# Patient Record
Sex: Female | Born: 1980 | Race: Black or African American | Hispanic: No | Marital: Single | Smoking: Former smoker
Health system: Southern US, Community
[De-identification: ages and names within clinical notes are randomized; demographics above are authoritative.]

## PROBLEM LIST (undated history)

## (undated) DIAGNOSIS — E119 Type 2 diabetes mellitus without complications: Secondary | ICD-10-CM

## (undated) DIAGNOSIS — Z9889 Other specified postprocedural states: Secondary | ICD-10-CM

## (undated) DIAGNOSIS — E669 Obesity, unspecified: Secondary | ICD-10-CM

## (undated) DIAGNOSIS — F319 Bipolar disorder, unspecified: Secondary | ICD-10-CM

## (undated) DIAGNOSIS — F209 Schizophrenia, unspecified: Secondary | ICD-10-CM

## (undated) DIAGNOSIS — R6250 Unspecified lack of expected normal physiological development in childhood: Secondary | ICD-10-CM

## (undated) DIAGNOSIS — Z8659 Personal history of other mental and behavioral disorders: Secondary | ICD-10-CM

## (undated) DIAGNOSIS — F79 Unspecified intellectual disabilities: Secondary | ICD-10-CM

## (undated) HISTORY — DX: Obesity, unspecified: E66.9

## (undated) HISTORY — DX: Other specified postprocedural states: Z98.890

## (undated) HISTORY — DX: Unspecified lack of expected normal physiological development in childhood: R62.50

## (undated) HISTORY — DX: Type 2 diabetes mellitus without complications: E11.9

## (undated) HISTORY — DX: Personal history of other mental and behavioral disorders: Z86.59

## (undated) HISTORY — PX: TONSILLECTOMY: SUR1361

---

## 2000-08-06 ENCOUNTER — Emergency Department (HOSPITAL_COMMUNITY): Admission: EM | Admit: 2000-08-06 | Discharge: 2000-08-07 | Payer: Self-pay | Admitting: Emergency Medicine

## 2000-10-17 ENCOUNTER — Inpatient Hospital Stay (HOSPITAL_COMMUNITY): Admission: AD | Admit: 2000-10-17 | Discharge: 2000-10-17 | Payer: Self-pay | Admitting: Obstetrics

## 2002-10-07 ENCOUNTER — Encounter: Payer: Self-pay | Admitting: Emergency Medicine

## 2002-10-07 ENCOUNTER — Emergency Department (HOSPITAL_COMMUNITY): Admission: EM | Admit: 2002-10-07 | Discharge: 2002-10-07 | Payer: Self-pay | Admitting: Emergency Medicine

## 2004-08-06 ENCOUNTER — Emergency Department (HOSPITAL_COMMUNITY): Admission: EM | Admit: 2004-08-06 | Discharge: 2004-08-06 | Payer: Self-pay | Admitting: Emergency Medicine

## 2004-08-17 ENCOUNTER — Ambulatory Visit: Payer: Self-pay | Admitting: Family Medicine

## 2004-08-18 ENCOUNTER — Ambulatory Visit: Payer: Self-pay | Admitting: Family Medicine

## 2004-11-08 ENCOUNTER — Ambulatory Visit: Payer: Self-pay | Admitting: Family Medicine

## 2004-12-14 ENCOUNTER — Ambulatory Visit: Payer: Self-pay | Admitting: Family Medicine

## 2004-12-14 ENCOUNTER — Other Ambulatory Visit: Admission: RE | Admit: 2004-12-14 | Discharge: 2004-12-14 | Payer: Self-pay | Admitting: Family Medicine

## 2004-12-20 ENCOUNTER — Ambulatory Visit: Payer: Self-pay | Admitting: Family Medicine

## 2005-01-15 ENCOUNTER — Ambulatory Visit (HOSPITAL_BASED_OUTPATIENT_CLINIC_OR_DEPARTMENT_OTHER): Admission: RE | Admit: 2005-01-15 | Discharge: 2005-01-15 | Payer: Self-pay | Admitting: Family Medicine

## 2005-01-16 DIAGNOSIS — G4733 Obstructive sleep apnea (adult) (pediatric): Secondary | ICD-10-CM | POA: Insufficient documentation

## 2005-01-16 DIAGNOSIS — G4730 Sleep apnea, unspecified: Secondary | ICD-10-CM | POA: Insufficient documentation

## 2005-01-25 ENCOUNTER — Ambulatory Visit: Payer: Self-pay | Admitting: Family Medicine

## 2005-01-29 ENCOUNTER — Ambulatory Visit: Payer: Self-pay | Admitting: Internal Medicine

## 2005-01-29 ENCOUNTER — Emergency Department (HOSPITAL_COMMUNITY): Admission: EM | Admit: 2005-01-29 | Discharge: 2005-01-29 | Payer: Self-pay | Admitting: Emergency Medicine

## 2005-01-30 ENCOUNTER — Ambulatory Visit: Payer: Self-pay | Admitting: Family Medicine

## 2005-04-07 ENCOUNTER — Ambulatory Visit: Payer: Self-pay | Admitting: Family Medicine

## 2005-04-18 ENCOUNTER — Ambulatory Visit: Payer: Self-pay | Admitting: Family Medicine

## 2005-04-26 ENCOUNTER — Ambulatory Visit (HOSPITAL_COMMUNITY): Admission: RE | Admit: 2005-04-26 | Discharge: 2005-04-26 | Payer: Self-pay | Admitting: Otolaryngology

## 2005-07-10 ENCOUNTER — Ambulatory Visit: Payer: Self-pay | Admitting: Family Medicine

## 2005-07-20 ENCOUNTER — Encounter: Admission: RE | Admit: 2005-07-20 | Discharge: 2005-09-19 | Payer: Self-pay | Admitting: Family Medicine

## 2005-07-21 ENCOUNTER — Ambulatory Visit: Payer: Self-pay | Admitting: Family Medicine

## 2005-07-24 ENCOUNTER — Ambulatory Visit: Payer: Self-pay | Admitting: Family Medicine

## 2005-08-17 ENCOUNTER — Inpatient Hospital Stay (HOSPITAL_COMMUNITY): Admission: RE | Admit: 2005-08-17 | Discharge: 2005-09-20 | Payer: Self-pay | Admitting: Otolaryngology

## 2005-08-17 ENCOUNTER — Encounter (INDEPENDENT_AMBULATORY_CARE_PROVIDER_SITE_OTHER): Payer: Self-pay | Admitting: *Deleted

## 2005-08-17 ENCOUNTER — Ambulatory Visit: Payer: Self-pay | Admitting: Critical Care Medicine

## 2005-08-18 ENCOUNTER — Encounter: Payer: Self-pay | Admitting: Cardiology

## 2005-08-18 ENCOUNTER — Ambulatory Visit: Payer: Self-pay | Admitting: Cardiology

## 2005-09-19 ENCOUNTER — Ambulatory Visit: Payer: Self-pay | Admitting: Physical Medicine & Rehabilitation

## 2005-09-20 ENCOUNTER — Inpatient Hospital Stay (HOSPITAL_COMMUNITY)
Admission: RE | Admit: 2005-09-20 | Discharge: 2005-09-26 | Payer: Self-pay | Admitting: Physical Medicine & Rehabilitation

## 2005-09-20 DIAGNOSIS — Z9089 Acquired absence of other organs: Secondary | ICD-10-CM | POA: Insufficient documentation

## 2005-09-29 ENCOUNTER — Ambulatory Visit: Payer: Self-pay | Admitting: Family Medicine

## 2005-09-29 DIAGNOSIS — D509 Iron deficiency anemia, unspecified: Secondary | ICD-10-CM | POA: Insufficient documentation

## 2005-09-30 ENCOUNTER — Inpatient Hospital Stay (HOSPITAL_COMMUNITY): Admission: EM | Admit: 2005-09-30 | Discharge: 2005-10-06 | Payer: Self-pay | Admitting: Emergency Medicine

## 2005-10-02 DIAGNOSIS — N390 Urinary tract infection, site not specified: Secondary | ICD-10-CM | POA: Insufficient documentation

## 2005-10-03 ENCOUNTER — Encounter: Payer: Self-pay | Admitting: Vascular Surgery

## 2005-10-11 ENCOUNTER — Ambulatory Visit: Payer: Self-pay | Admitting: Family Medicine

## 2005-12-21 ENCOUNTER — Ambulatory Visit: Payer: Self-pay | Admitting: Family Medicine

## 2005-12-29 ENCOUNTER — Ambulatory Visit: Payer: Self-pay | Admitting: Internal Medicine

## 2006-07-30 IMAGING — CR DG CHEST 1V PORT
1 series · 1 of 1 positions shown · non-contrast
Comparison: 08/24/05.

CLINICAL DATA: Tonsillar hypertrophy and ARDS.
 PORTABLE CHEST - 1 VIEW - 08/25/05:

[view not recorded]
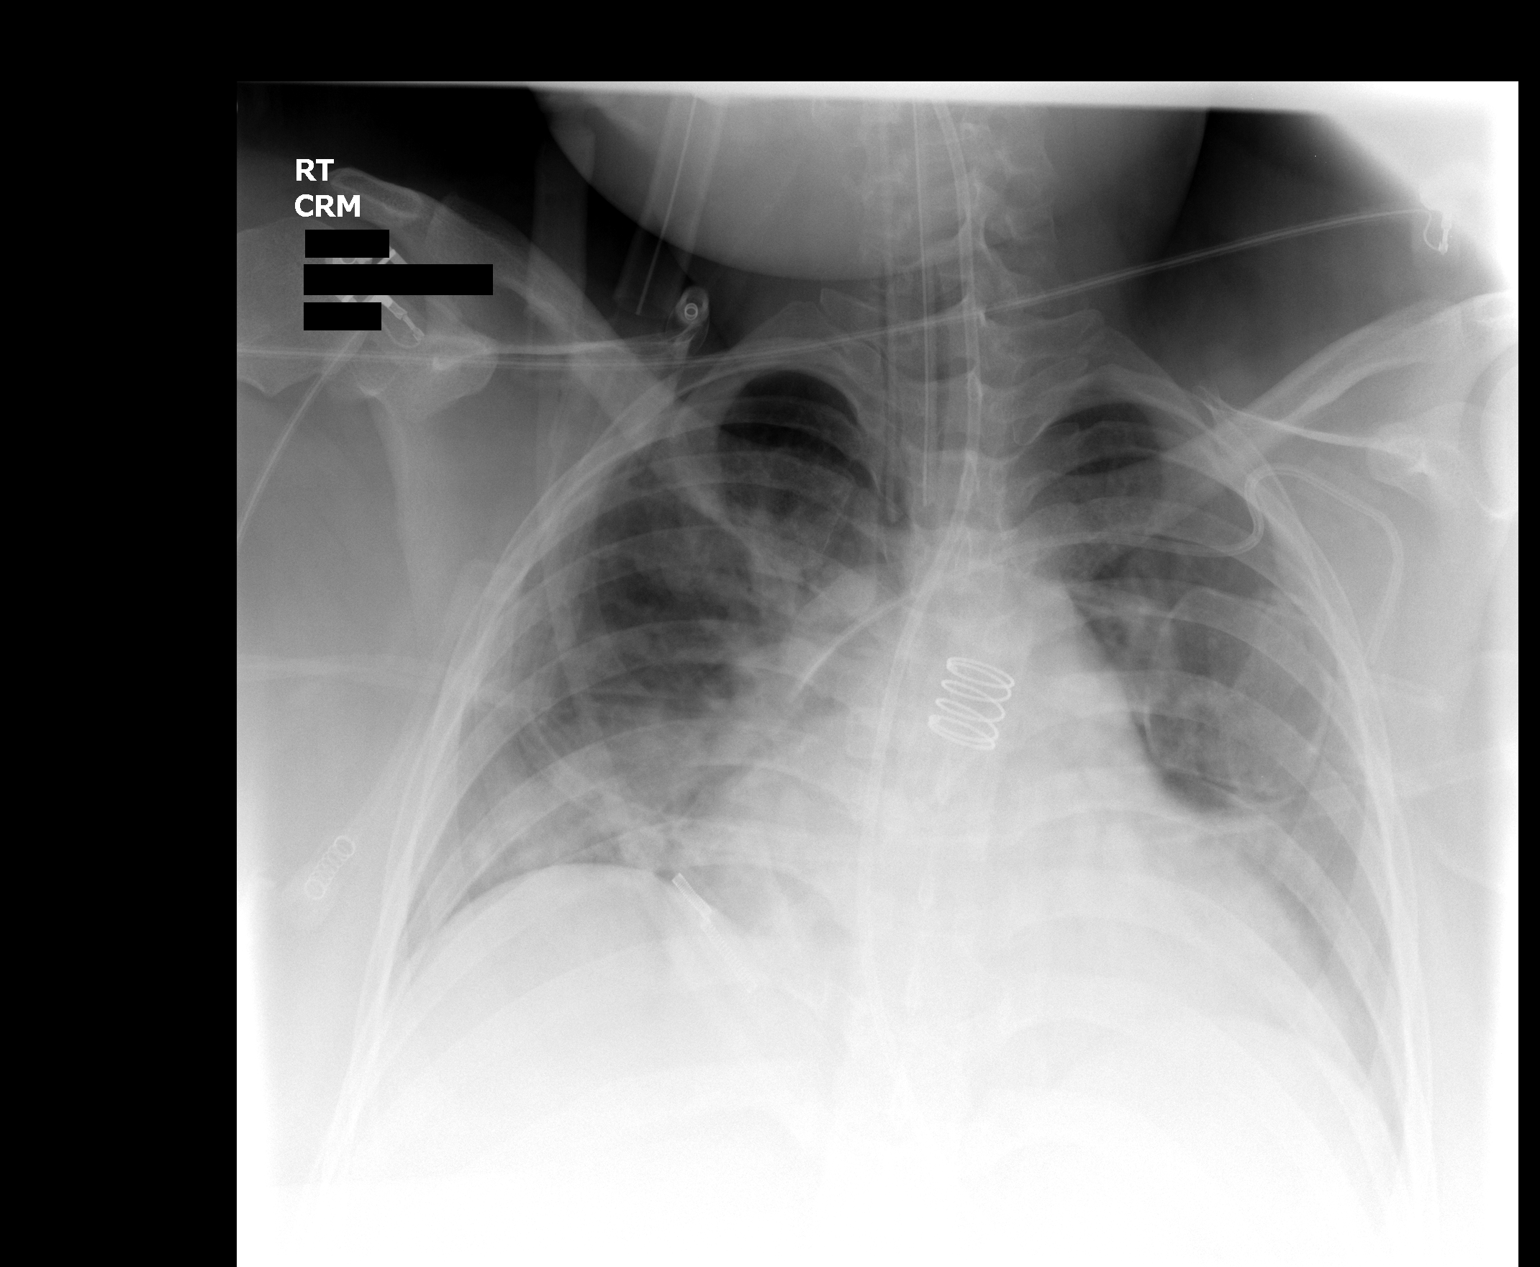

[1 of 1 positions shown; findings below may reference images not displayed]

FINDINGS: AP film at 7077 hours shows stable low lung volumes with no change in cardiomegaly and bilateral air space disease.  The endotracheal tube, left subclavian central line, and feeding catheter remain in place.
IMPRESSION: Stable exam.

## 2006-08-02 IMAGING — CR DG CHEST 1V PORT
1 series · 1 of 1 positions shown · non-contrast
Comparison: 08/27/05.

CLINICAL DATA: 24-year-old female, tonsillar hypertrophy, dependent respiratory failure.  
 PORTABLE CHEST ? 1 VIEW:

[view not recorded]
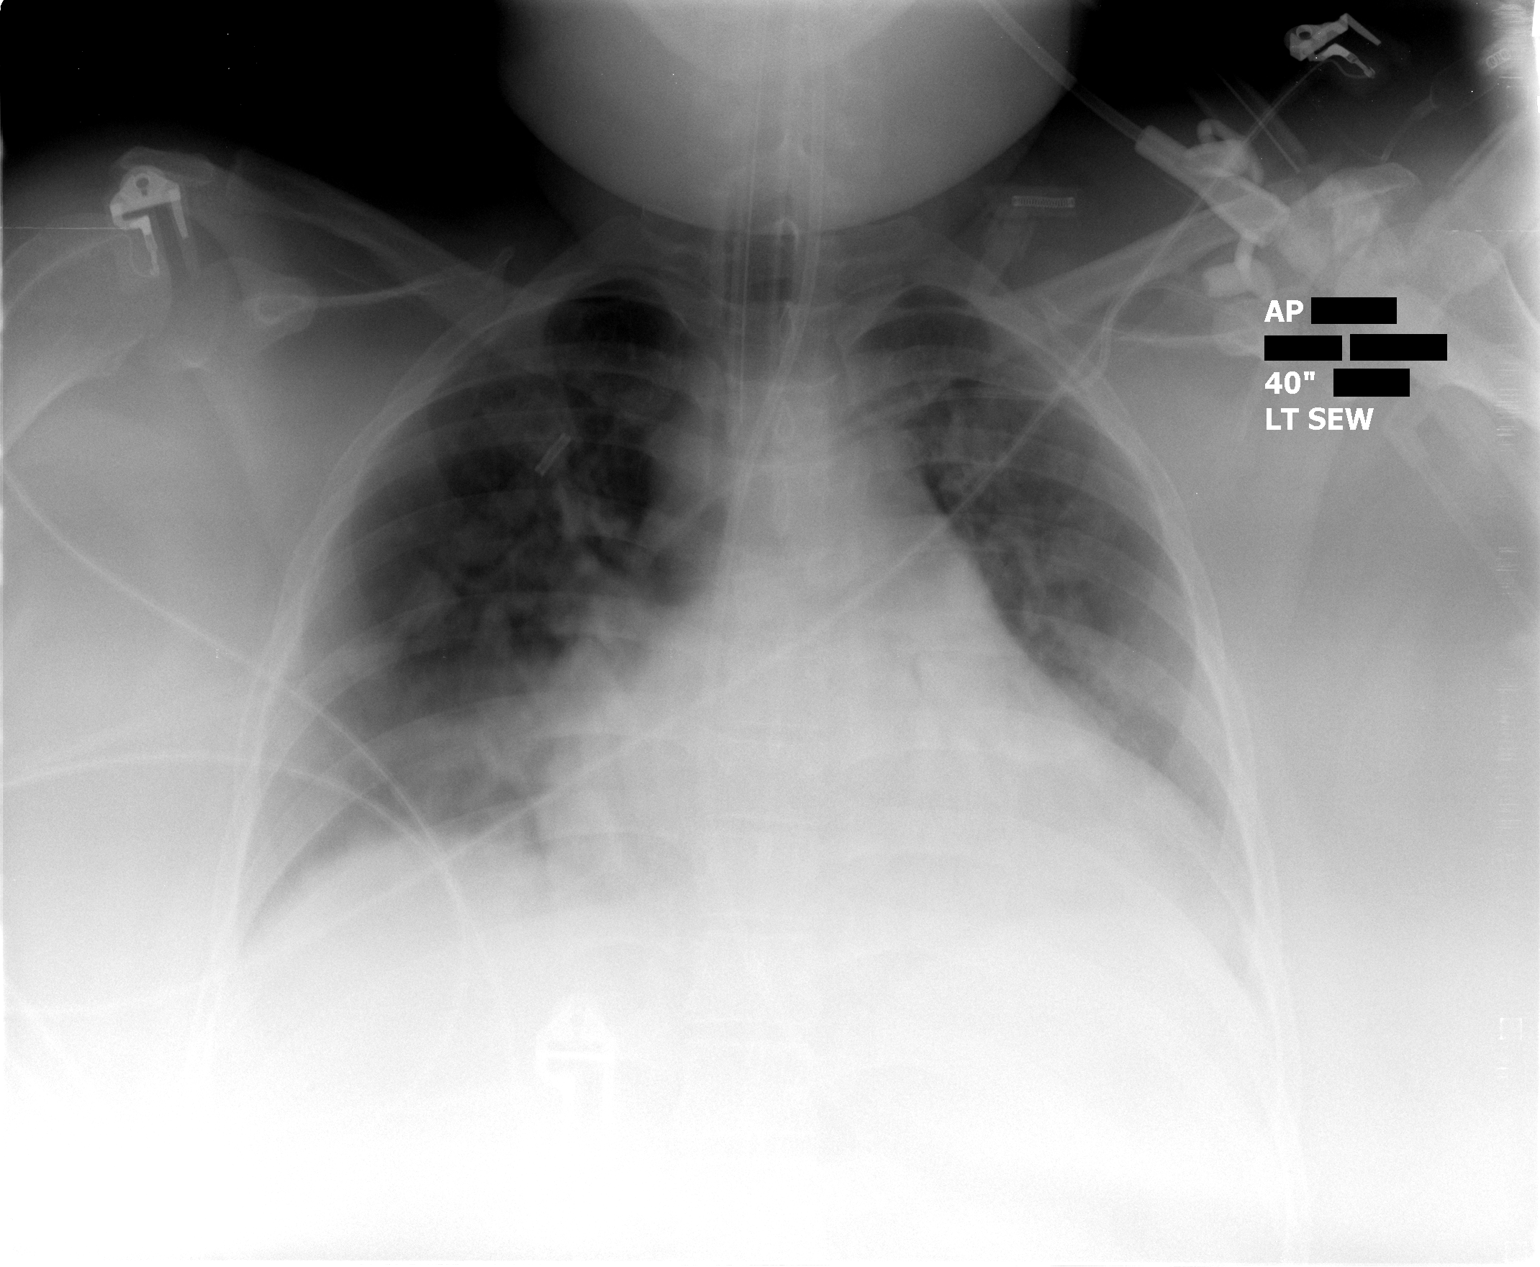

[1 of 1 positions shown; findings below may reference images not displayed]

FINDINGS: Endotracheal tube, left subclavian central line and feeding tube remain.  Exam is limited because of portable technique and patient size.  The heart remains enlarged with stable bilateral airspace disease versus edema.  No significant change in aeration.  Lung volumes remain low.
IMPRESSION: Stable cardiomegaly, low lung volumes and bilateral airspace process.

## 2006-08-04 IMAGING — CR DG ABD PORTABLE 1V
1 series · 1 of 1 positions shown · non-contrast
Comparison: none

CLINICAL DATA: Panda placement.
 PORTABLE ABDOMEN - 1 VIEW:

[view not recorded]
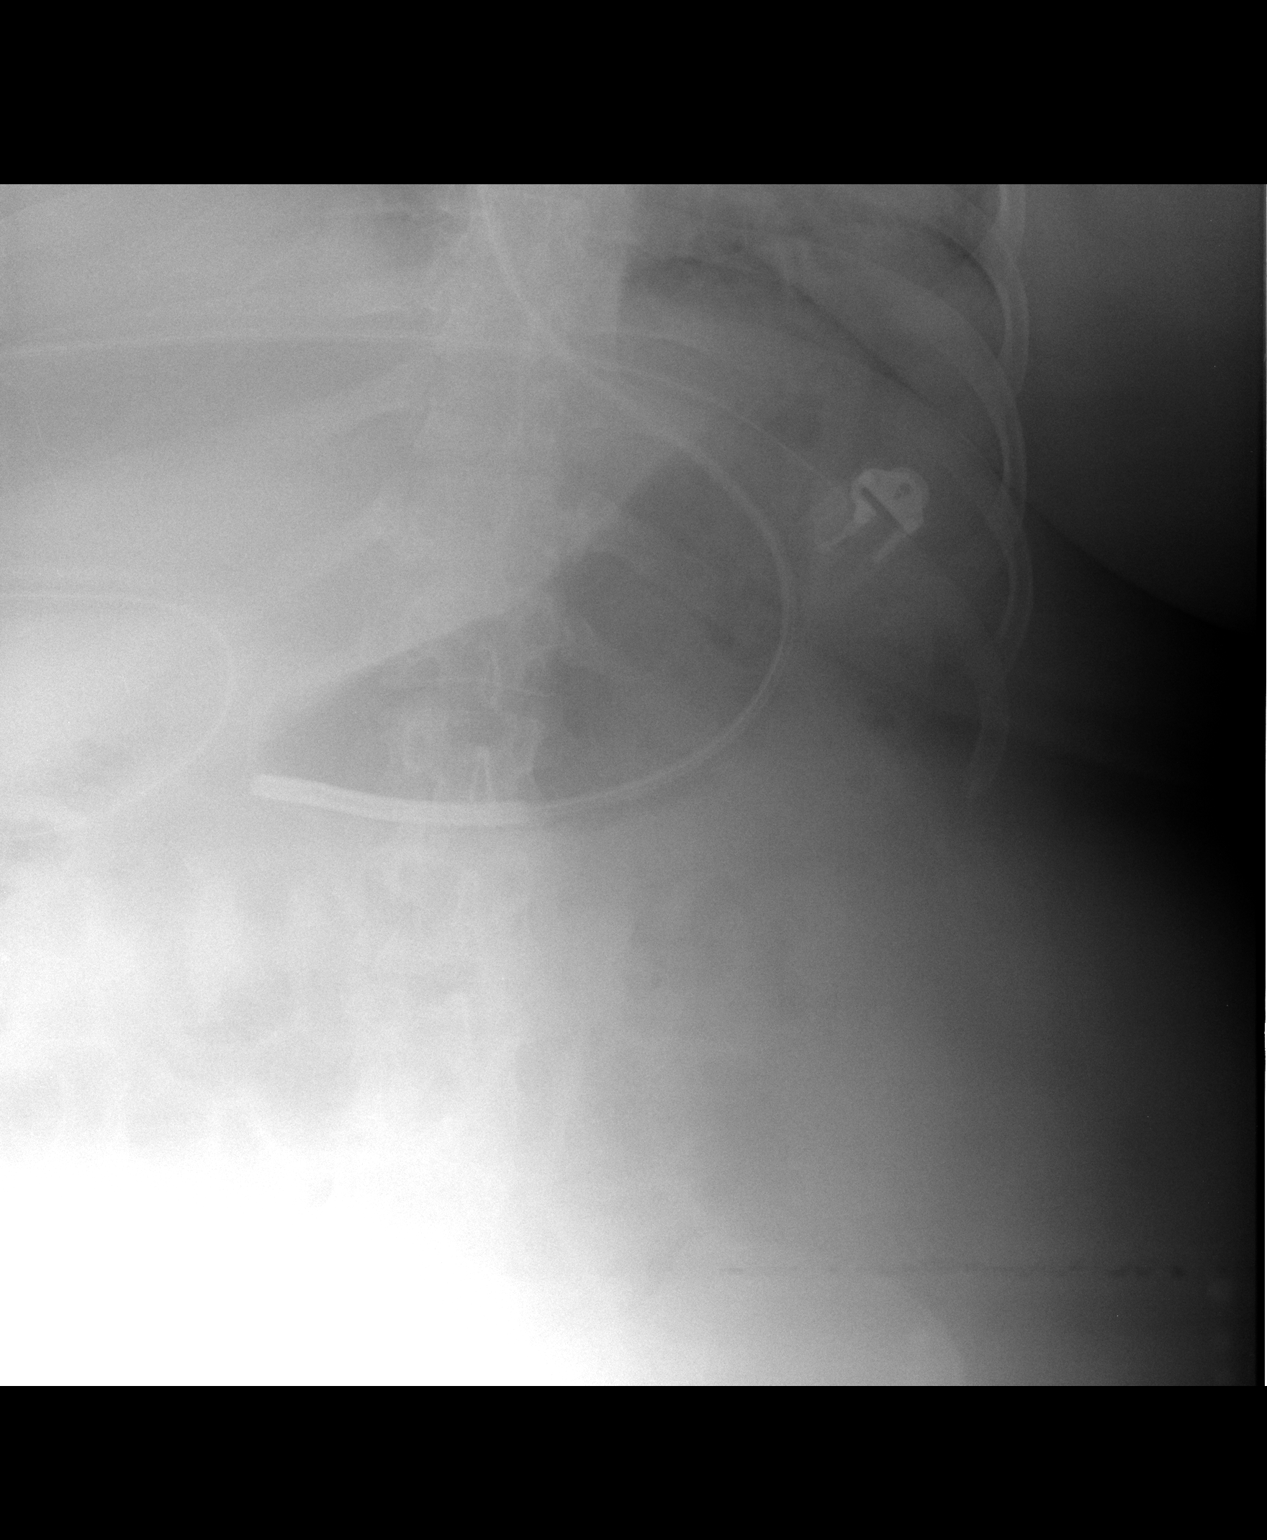

[1 of 1 positions shown; findings below may reference images not displayed]

FINDINGS: Panda tube is in place with the tip in the gastric antrum directed toward the pylorus.
IMPRESSION: As discussed above.

## 2006-08-29 ENCOUNTER — Other Ambulatory Visit: Admission: RE | Admit: 2006-08-29 | Discharge: 2006-08-29 | Payer: Self-pay | Admitting: Family Medicine

## 2006-08-29 ENCOUNTER — Ambulatory Visit: Payer: Self-pay | Admitting: Family Medicine

## 2006-08-29 ENCOUNTER — Encounter (INDEPENDENT_AMBULATORY_CARE_PROVIDER_SITE_OTHER): Payer: Self-pay | Admitting: Family Medicine

## 2006-09-13 ENCOUNTER — Encounter (INDEPENDENT_AMBULATORY_CARE_PROVIDER_SITE_OTHER): Payer: Self-pay | Admitting: Family Medicine

## 2006-09-13 DIAGNOSIS — K219 Gastro-esophageal reflux disease without esophagitis: Secondary | ICD-10-CM | POA: Insufficient documentation

## 2006-09-13 DIAGNOSIS — F3160 Bipolar disorder, current episode mixed, unspecified: Secondary | ICD-10-CM | POA: Insufficient documentation

## 2006-09-13 DIAGNOSIS — L03119 Cellulitis of unspecified part of limb: Secondary | ICD-10-CM

## 2006-09-13 DIAGNOSIS — L02419 Cutaneous abscess of limb, unspecified: Secondary | ICD-10-CM | POA: Insufficient documentation

## 2006-09-13 DIAGNOSIS — F909 Attention-deficit hyperactivity disorder, unspecified type: Secondary | ICD-10-CM | POA: Insufficient documentation

## 2006-09-13 DIAGNOSIS — F71 Moderate intellectual disabilities: Secondary | ICD-10-CM | POA: Insufficient documentation

## 2007-01-22 ENCOUNTER — Emergency Department (HOSPITAL_COMMUNITY): Admission: EM | Admit: 2007-01-22 | Discharge: 2007-01-22 | Payer: Self-pay | Admitting: Emergency Medicine

## 2007-07-24 ENCOUNTER — Telehealth (INDEPENDENT_AMBULATORY_CARE_PROVIDER_SITE_OTHER): Payer: Self-pay | Admitting: *Deleted

## 2007-08-08 ENCOUNTER — Telehealth (INDEPENDENT_AMBULATORY_CARE_PROVIDER_SITE_OTHER): Payer: Self-pay | Admitting: Family Medicine

## 2007-09-10 ENCOUNTER — Telehealth (INDEPENDENT_AMBULATORY_CARE_PROVIDER_SITE_OTHER): Payer: Self-pay | Admitting: Family Medicine

## 2007-09-11 ENCOUNTER — Encounter (INDEPENDENT_AMBULATORY_CARE_PROVIDER_SITE_OTHER): Payer: Self-pay | Admitting: Family Medicine

## 2007-10-15 ENCOUNTER — Encounter (INDEPENDENT_AMBULATORY_CARE_PROVIDER_SITE_OTHER): Payer: Self-pay | Admitting: Family Medicine

## 2007-10-15 ENCOUNTER — Ambulatory Visit: Payer: Self-pay | Admitting: Family Medicine

## 2007-10-15 LAB — CONVERTED CEMR LAB
Nitrite: NEGATIVE
Pap Smear: NORMAL
Specific Gravity, Urine: 1.01
Urobilinogen, UA: 0.2
WBC Urine, dipstick: NEGATIVE

## 2007-10-16 ENCOUNTER — Encounter (INDEPENDENT_AMBULATORY_CARE_PROVIDER_SITE_OTHER): Payer: Self-pay | Admitting: Family Medicine

## 2007-10-18 LAB — CONVERTED CEMR LAB
ALT: 12 units/L (ref 0–35)
AST: 13 units/L (ref 0–37)
Albumin: 4 g/dL (ref 3.5–5.2)
Basophils Absolute: 0 10*3/uL (ref 0.0–0.1)
Basophils Relative: 0 % (ref 0–1)
CO2: 26 meq/L (ref 19–32)
Calcium: 8.9 mg/dL (ref 8.4–10.5)
Cholesterol: 149 mg/dL (ref 0–200)
Eosinophils Absolute: 0.1 10*3/uL (ref 0.0–0.7)
GC Probe Amp, Genital: NEGATIVE
Glucose, Bld: 90 mg/dL (ref 70–99)
Hemoglobin: 12.9 g/dL (ref 12.0–15.0)
Monocytes Relative: 8 % (ref 3–12)
Neutrophils Relative %: 61 % (ref 43–77)
Platelets: 257 10*3/uL (ref 150–400)
RBC: 5.08 M/uL (ref 3.87–5.11)
RDW: 14.8 % (ref 11.5–15.5)
TSH: 3.658 microintl units/mL (ref 0.350–4.50)
Total Bilirubin: 0.6 mg/dL (ref 0.3–1.2)
Total CHOL/HDL Ratio: 3
Triglycerides: 88 mg/dL (ref <150)
WBC: 12.1 10*3/uL — ABNORMAL HIGH (ref 4.0–10.5)

## 2007-10-30 ENCOUNTER — Telehealth (INDEPENDENT_AMBULATORY_CARE_PROVIDER_SITE_OTHER): Payer: Self-pay | Admitting: *Deleted

## 2007-10-30 ENCOUNTER — Encounter (INDEPENDENT_AMBULATORY_CARE_PROVIDER_SITE_OTHER): Payer: Self-pay | Admitting: Family Medicine

## 2007-11-07 ENCOUNTER — Encounter (INDEPENDENT_AMBULATORY_CARE_PROVIDER_SITE_OTHER): Payer: Self-pay | Admitting: Nurse Practitioner

## 2007-11-12 ENCOUNTER — Encounter (INDEPENDENT_AMBULATORY_CARE_PROVIDER_SITE_OTHER): Payer: Self-pay | Admitting: *Deleted

## 2008-01-07 ENCOUNTER — Ambulatory Visit: Payer: Self-pay | Admitting: Family Medicine

## 2008-01-07 DIAGNOSIS — B354 Tinea corporis: Secondary | ICD-10-CM | POA: Insufficient documentation

## 2008-01-07 LAB — CONVERTED CEMR LAB
Bilirubin Urine: NEGATIVE
Blood in Urine, dipstick: NEGATIVE
Glucose, Urine, Semiquant: NEGATIVE
Nitrite: NEGATIVE
Protein, U semiquant: NEGATIVE
Specific Gravity, Urine: 1.01
pH: 6.5

## 2008-01-28 ENCOUNTER — Encounter: Admission: RE | Admit: 2008-01-28 | Discharge: 2008-03-02 | Payer: Self-pay | Admitting: Family Medicine

## 2008-01-28 ENCOUNTER — Encounter (INDEPENDENT_AMBULATORY_CARE_PROVIDER_SITE_OTHER): Payer: Self-pay | Admitting: Family Medicine

## 2008-03-03 ENCOUNTER — Encounter: Admission: RE | Admit: 2008-03-03 | Discharge: 2008-03-03 | Payer: Self-pay | Admitting: Family Medicine

## 2008-03-13 ENCOUNTER — Telehealth (INDEPENDENT_AMBULATORY_CARE_PROVIDER_SITE_OTHER): Payer: Self-pay | Admitting: *Deleted

## 2008-03-24 ENCOUNTER — Ambulatory Visit: Payer: Self-pay | Admitting: Family Medicine

## 2008-03-24 DIAGNOSIS — B353 Tinea pedis: Secondary | ICD-10-CM | POA: Insufficient documentation

## 2008-03-24 DIAGNOSIS — M545 Low back pain: Secondary | ICD-10-CM

## 2008-03-24 LAB — CONVERTED CEMR LAB
LDL Cholesterol: 101 mg/dL — ABNORMAL HIGH (ref 0–99)
VLDL: 21 mg/dL (ref 0–40)

## 2008-04-17 ENCOUNTER — Encounter (INDEPENDENT_AMBULATORY_CARE_PROVIDER_SITE_OTHER): Payer: Self-pay | Admitting: Family Medicine

## 2008-04-17 ENCOUNTER — Ambulatory Visit: Payer: Self-pay | Admitting: Nurse Practitioner

## 2008-04-17 DIAGNOSIS — K5289 Other specified noninfective gastroenteritis and colitis: Secondary | ICD-10-CM

## 2008-05-26 ENCOUNTER — Telehealth (INDEPENDENT_AMBULATORY_CARE_PROVIDER_SITE_OTHER): Payer: Self-pay | Admitting: *Deleted

## 2008-06-02 ENCOUNTER — Ambulatory Visit: Payer: Self-pay | Admitting: Family Medicine

## 2008-06-02 DIAGNOSIS — J069 Acute upper respiratory infection, unspecified: Secondary | ICD-10-CM | POA: Insufficient documentation

## 2008-06-02 DIAGNOSIS — M940 Chondrocostal junction syndrome [Tietze]: Secondary | ICD-10-CM | POA: Insufficient documentation

## 2008-06-10 ENCOUNTER — Telehealth (INDEPENDENT_AMBULATORY_CARE_PROVIDER_SITE_OTHER): Payer: Self-pay | Admitting: Family Medicine

## 2008-08-27 ENCOUNTER — Encounter (INDEPENDENT_AMBULATORY_CARE_PROVIDER_SITE_OTHER): Payer: Self-pay | Admitting: Family Medicine

## 2009-10-16 ENCOUNTER — Emergency Department (HOSPITAL_COMMUNITY): Admission: EM | Admit: 2009-10-16 | Discharge: 2009-10-16 | Payer: Self-pay | Admitting: Emergency Medicine

## 2010-02-20 ENCOUNTER — Emergency Department (HOSPITAL_COMMUNITY): Admission: EM | Admit: 2010-02-20 | Discharge: 2010-02-20 | Payer: Self-pay | Admitting: Emergency Medicine

## 2010-08-16 NOTE — Consult Note (Signed)
NAMECATHALINA, BARCIA NO.:  [id number]   MEDICAL RECORD NO.:  [id number]          PATIENT TYPE:  EMS   LOCATION:  MAJO                         FACILITY:  [facility]   PHYSICIAN:  Kristine Garbe. Ezzard Standing, M.D.DATE OF BIRTH:  [birth year]   DATE OF CONSULTATION:  01/22/2007  DATE OF DISCHARGE:                                 CONSULTATION   REASON FOR EMERGENCY ROOM VISIT:  Evaluate patient with tracheotomy.   HISTORY:  [name] is a 30 year old obese female with severe  obstructive sleep apnea.  She had a trache placed about a year ago.  She  wears a #6 Shiley cuffless trache.  This was removed last night by  herself.  She did not inform any of her caretakers, and presents to the  emergency room with a trache that cannot be replaced by her caretakers.  Respiratory therapy attempted to replace the #6 and also tried a #4  Shiley, but could not place in the tracheotomy site.  I was consulted to  replace the trache, evaluate the patient.  [name] has no respiratory  difficulty and uses the trache primarily at night when she sleeps.  A #4  Shiley trache tube was placed without any difficulty.  There is some  granulation tissue and a little bit of swelling around the tracheotomy  site.  She was able to tolerate the #4 Shiley trache cork well with no  respiratory difficulty.  She previously used a Network engineer and used [employer] valve, mostly during the day and uncorked through the night.  With  the #4 Shiley, she is able tolerate this cork and breathing well and can  probably tolerate this without using a Passy-Muir valve and using the  trache at night uncorked.  Review this with her caretakers.  I will have  her follow up in my office in 2 weeks for recheck.           ______________________________  Kristine Garbe. Ezzard Standing, M.D.     CEN/MEDQ  D:  01/22/2007  T:  01/22/2007  Job:  [id number]

## 2010-08-19 NOTE — Discharge Summary (Signed)
NAMEHAILEY, [name]               ACCOUNT NO.:  [id number]   MEDICAL RECORD NO.:  [id number]          PATIENT TYPE:  INP   LOCATION:  3101                         FACILITY:  [facility]   PHYSICIAN:  Leslye Peer, M.D.  DATE OF BIRTH:  [birth year]   DATE OF ADMISSION:  08/17/2005  DATE OF DISCHARGE:  09/20/2005                                 DISCHARGE SUMMARY   DISCHARGE DIAGNOSES:  1.  Status post acute on chronic respiratory failure/ventilator dependent      respiratory failure secondary to acute upper airway obstruction and      negative pressure fractured capillary pulmonary edema/acute lung injury.  2.  Tracheostomy dependent secondary to morbid obesity, obstructive sleep      apnea, and obesity hypoventilation syndrome.  3.  Cor pulmonale.  4.  Agitated delirium superimposed on underlying bipolar disease and      schitzoeffective disorder as well as mental retardation.  5.  Serratia ventilator associated pneumonia with associated bacteremia      treated May 25 to September 08, 2005 with IV Maxipime.  6.  Coag negative staph x1 blood culture treated with IV Maxipime May 25 to      June 8.  7.  E. coli urinary tract infection treated with IV Maxipime May 25 to September 08, 2005.   PROCEDURE:  1.  Tonsillectomy and adenoidectomy, Aug 17, 2005, Dr. Narda Bonds.  2.  Tracheostomy with #6 Shiley, Aug 29, 2005, Dr. Narda Bonds.  3.  #8 oral endotracheal tube,  May 17 to May 29.  4.  Left subclavian triple lumen catheter, May 17 to May 30.  5.  Left brachial A line, Aug 17, 2005 to Aug 29, 2005.  6.  Right brachial PICC line placed Aug 30, 2005 to present.  7.  #6 trache placed Aug 29, 2005 as above.   CONSULTATIONS:  Antonietta Breach, M.D. on September 04, 2005.   LABORATORY DATA:  1.  September 17, 2005, white blood cells 9.3, hemoglobin 10.3, hematocrit 31.3,      platelets 284.  2.  September 17, 2005, sodium 141, potassium 4.1, chloride 105, CO2 29, glucose      88, BUN 6, creatinine 0.6,  calcium 8.8.   MICROBIOLOGY:  1.  September 16, 2005, respiratory culture, results nonpathogenic oropharyngeal      flora.  2.  Aug 25, 2005, coag negative staph from brachial artery A line cath tip.  3.  Aug 25, 2005, coag negative staph from blood culture.  4.  Aug 27, 2005, blood culture positive with Serratia marcescens.  5.  September 15, 2005, urine culture, E. coli.   BRIEF HISTORY:  This is a morbidly obese 30 year old African-American female  whose baseline weight is 358 pounds with known obstructive sleep apnea  (noncompliant with CPAP), mental retardation, lives in a group home.  Pulmonary critical care team was consulted initially following tonsillectomy  whereby the patient developed acute respiratory distress requiring  reintubation per anesthesia in the post anesthesia recovery room. She  developed extensively high plateau pressures on mechanical ventilation in  excess of 40 cmH2O, she continued to desaturate in spite of high levels of  PEEP of 14-16 cmH2O, as well as recruitment maneuvers. She was significantly  hypotensive secondary to shock and air trapping, she required extensive  resuscitation in the recovery room for refractory shock.   HOSPITAL COURSE BY PROBLEM:  #1.  Acute on chronic respiratory  failure/ventilatory dependent respiratory failure secondary to upper airway  obstruction, and negative pressure capillary fracture pulmonary edema  requiring intubation and mechanical ventilation. [name] was admitted  initially to the medical intensive care. Therapy included empiric  antibiotics with Zosyn for questionable aspiration, initially low tidal  volume ventilation with high levels of PEEP, and aggressive bronchodilators.  As her hypotension resolved after returning to the intensive care she was  then able to transition to aggressive diuresis, she also completed a course  of steroids from May 19 to May 25. Her pulmonary status continued to improve  from a mechanical  ventilation standpoint whereby she was able to initiate  weaning shortly after her stay in the intensive care however give her body  habitus, it was felt unsafe to extubate her without securing an airway. She  therefore underwent tracheostomy on Aug 29, 2005 by Dr. Dillard Cannon.  #2[place]el dependent secondary to upper airway obstruction, and remains  trache dependent secondary to obesity hypoventilation syndrome and  obstructive sleep apnea, again her weight is in excess of 350 pounds upon  time of discharge. Her last recorded weight on September 13, 2005 was 145.4 kg.  Following tracheostomy on Aug 29, 2005, [name] was quickly weaned from  mechanical ventilation and liberated from mechanical ventilation shortly  thereafter. She has since then been weaned to room air support, she has been  transitioned to a cuffless #6 Shiley and is currently tolerating Passy-Muir  valve trials as well as oral intake. It is the opinion of Dr. Delton Coombes and the  pulmonary critical care team that [name]'s long-term plan should include  tracheostomy indefinitely, however should she be successful in significant  weight loss this could certainly be reevaluated in months, to years to come.  #3.  Cor pulmonale, probably secondary to chronic hypoxic respiratory  failure/chronic pulmonary artery hypertension. Treated primarily with  diuresis during this hospitalization and will require occasional p.r.n.  Lasix doses in the outpatient setting.  #4.  Shock, transient after surgery most likely secondary to postoperative  SIRS, as well as air stacking and auto PEEP on mechanical ventilation. This  is treated aggressively with volume resuscitation, early goal directed  therapy approach, as well as empiric antibiotics.  #5.  Serratia bacteremia, most certainly contributing to hypotension and  SIRS, as well as low grade sepsis. She completed a course of Maxipime from Aug 25, 2005 to September 08, 2005.  #6.  E. coli  urinary tract infection, Aug 25, 2005 treated empirically with  Maxipime Aug 25, 2005 to September 08, 2005 and resolved, of date all culture data  is negative in blood culture and urine culture both dated September 08, 2005 as  well as respiratory culture dated September 16, 2005. She did complete another  two-day course of empiric Vancomycin and Zosyn for a questionable tracheal  bronchitis and low-grade fever which demonstrated to be normal oropharyngeal  flora.  #7.  Agitated delirium superimposed on underlying bipolar disease,  schitzoeffective disorder and mental retardation. This was a very large  factor in successfully weaning Ms. Dubuque from mechanical ventilation. She  was initially on [facility]  intensive care sedation protocol, with fentanyl  and Versed. This was eventually transitioned under the assistance of Dr.  Antonietta Breach to the discharge regimen to be named in the following  medication instructions.  #8.  Debilitation. Given the patient's morbid obesity, as well as prolonged  hospitalization, she will require physical rehabilitation. She has been  evaluated by the [facility] for  discharge.   ALLERGIES:  No known drug allergies.   DISCHARGE MEDICATIONS:  1.  Lovenox 80 mg subcu q.24 h.  2.  Clonazepam 0.5 mg q.h.s.  3.  Pepcid 20 mg p.o. b.i.d.  4.  Ferrous sulfate 325 mg p.o. b.i.d.  5.  Valproic acid 500 mg p.o. t.i.d.  6.  Lopressor 25 mg p.o. q.12 h.  7.  Potassium chloride 20 mEq p.o. b.i.d.  8.  Seroquel 100 mg p.o. t.i.d.  9.  Cogentin 2 mg IM b.i.d. p.r.n. or 2 mg p.o. b.i.d. p.r.n.  10. Tylenol 650 mg p.o. p.r.n.  11. Sliding scale insulin with the following coverage a.c. and h.s.      coverage, moderate resistant:  CBG of 60-100=0 units, 101-150=2 units,      151-200=3 units, 201-250=5 units, 251-300=7 units, 301-350=9 units,      greater than 350=11 units.   DIET:  Mechanical soft diet, carb modified.   ACTIVITY:  Out of  bed to chair, routine trache care per hospital policy and  procedure as well as routine Foley care, as well as routine PICC care per  policy and procedure [facility].   PHYSICAL EXAMINATION AND DISPOSITION AT DISCHARGE:  Currently vital signs  are stable, room air saturations 100%, I&O negative 100 for last 8 hours,  respirations 29-33 breaths per minute.  ENT:  Trache with only minimal secretions, currently vocalizes well through  Passy-Muir valve.  PULMONARY:  Assessment with scattered rhonchi and decrease in the bases,  chronically elevated respiratory rate in the high 20s to 30s secondary to  abdominal size.  CARDIAC:  Regular rate and rhythm.  EXTREMITIES:  Trace edema, 2+ pulses, brisk cap refill.  ABDOMEN:  Enlarged, nontender, tolerating diet.  GU:  Clear, yellow.  NEUROLOGIC:  Awake, follows commands, no focal deficits.  DISPOSITION:  Currently ready for discharge, has been medically cleared for  inpatient discharge from pulmonary critical care standpoint.      Anders Simmonds, N.P. [facility]    ______________________________  Leslye Peer, M.D.    PB/MEDQ  D:  09/19/2005  T:  09/19/2005  Job:  [id number]   cc:   [facility]   Antonietta Breach, M.D.   Kristine Garbe. Ezzard Standing, M.D.  Fax: [phone]

## 2010-08-19 NOTE — H&P (Signed)
[name], [name] NO.:  [id number]   MEDICAL RECORD NO.:  [id number]          PATIENT TYPE:  INP   LOCATION:  1405                         FACILITY:  [facility]   PHYSICIAN:  Georgann Housekeeper, MD      DATE OF BIRTH:  06-24-1980   DATE OF ADMISSION:  09/29/2005  DATE OF DISCHARGE:                                HISTORY & PHYSICAL   PRIMARY CARE PHYSICIAN:  None.   CHIEF COMPLAINT:  Left edema, sore and weakness.   HISTORY OF PRESENT ILLNESS:  [age]-year-old female with history of  morbid obesity, status post tonsillectomy and adenoidectomy, May or June of  2007, postop complication with ventilator-dependent respiratory failure,  required tracheostomy, a prolonged course, by the Critical Pulmonary  Medicine, had some bacteriemia with Staph UTI and anemia and discharged on  September 19, 2005.  Initially, she was admitted by Pulmonary for some shortness  of breath and deconditioning and was discharged on September 26, 2005 to her  group home.  The patient went to [facility] today complaining of leg  swelling which started by about 2 or 3 days and some discomfort in the legs  and weakness.  The patient has been at a group home and taken care of by the  caretaker.  She came to the emergency room at [facility] from [place].  The patient denies any fevers or chills, no shortness of breath.  She had  noticed her swelling to be worse when she got out of the hospital about 2 or  3 days ago and some discomfort and no fevers.  In the emergency room, she  initially had a fever of 100.  White count was mildly elevated at 12,000.  The caretaker cannot take care of her in her home and needs to be placed for  possible short-term.   ALLERGIES:  No known drug allergies.   CURRENT MEDICATIONS:  1.  Seroquel 100 mg t.i.d.  2.  Iron 325 b.i.d.  3.  Klonopin 0.5 mg nightly.  4.  Depakote 500 mg t.i.d.  5.  Lopressor 25 mg b.i.d.  6.  Nexium 40 mg daily.  7.  Potassium 20 mEq  b.i.d.   MEDICAL PROBLEMS:  1.  History of bipolar disorder with schizoaffective features.  2.  Mental retardation.  3.  Morbid obesity.  4.  Obstructive sleep apnea.  5.  Gastroesophageal reflux disease.  6.  Status post tonsillectomy and adenoidectomy in June of 2007 complicated      by ventilator-dependent respiratory failure, requiring a tracheostomy      and some pulmonary edema and also Staph bacteriemia.  7.  History of anemia.   SOCIAL HISTORY:  The patient lives in a group home.  No tobacco or alcohol.   FAMILY HISTORY:  Noncontributory.   PHYSICAL EXAMINATION:  VITAL SIGNS:  Temperature 98.2; initially when she  came in it was 100.1.  Blood pressure 133/74.  Pulse rate 89.  Respirations  20.  Pulse oximetry 99%.  GENERAL:  Awake and alert.  She had a tracheostomy collar in place.  LUNGS:  Clear.  CARDIAC:  Regular, S1 and S2, without any murmurs.  ABDOMEN:  Obese, soft.  EXTREMITIES:  She had some edema, 1 to 2+, in both legs and with some mild  warmth on the lower legs and redness with considerable mild cellulitis.   LABORATORY DATA:  White count of 12.8, hemoglobin 10.5.  BNP was 40.  LFTs  normal.  Chemistries:  Sodium 146, potassium 4.3, sugar of 95, BUN of 6,  creatinine 0.6.   IMPRESSION:  [age]-year-old female with multiple medical issues with  psychiatric disorder, mental retardation, recent tonsillectomy with  complication of pulmonary failure/respiratory failure.   1.  Leg edema:  BNP is negative.  Most likely dependent edema, venous stasis      and some mild cellulitis and stasis dermatitis.  2.  Deconditioning and failure to thrive.  3.  Obstructive sleep apnea.  4.  Anemia.   PLAN:  Admit for IV antibiotic, leg elevation and Lasix, Social Services  consult, PT and OT consult.  Continue her psychiatric medications.   PAST MEDICAL HISTORY:      Georgann Housekeeper, MD  Electronically Signed     KH/MEDQ  D:  09/30/2005  T:  09/30/2005  Job:   [id number]

## 2010-08-19 NOTE — Op Note (Signed)
[name], [name]               ACCOUNT NO.:  [id number]   MEDICAL RECORD NO.:  [id number]           PATIENT TYPE:   LOCATION:                                 FACILITY:   PHYSICIAN:  Kristine Garbe. Ezzard Standing, M.D. DATE OF BIRTH:   DATE OF PROCEDURE:  08/29/2005  DATE OF DISCHARGE:                                 OPERATIVE REPORT   PREOPERATIVE DIAGNOSIS:  Obstructive sleep apnea.   POSTOPERATIVE DIAGNOSIS:  Obstructive sleep apnea.   OPERATION:  Tracheotomy with a #6 Shiley trach.   SURGEON:  Kristine Garbe. Ezzard Standing, M.D.   ANESTHESIA:  General endotracheal.   COMPLICATIONS:  None.   BRIEF CLINICAL NOTE:  [name] is a 30 year old female who is status  post tonsillectomy 12 days ago.  Postoperatively, she developed pulmonary  edema requiring admission on vent to ICU.  She has severe obstructive sleep  apnea.  Pulmonary-wise she has improved, but because of her severe  obstructive sleep apnea, she is taken to the operating room at this time for  tracheotomy for better management.   DESCRIPTION OF PROCEDURE:  The patient was brought to the operating room  from the ICU.  She was positioned.  Neck was prepped with Xylocaine solution  and injected with Xylocaine with epinephrine for hemostasis.  A vertical  incision was made just above the cricoid cartilage in the midline.  Dissection was carried down to the subcutaneous tissue and the strap  muscles.  The trachea was identified.  Thyroid isthmus was divided with the  cautery.  A T shaped tracheotomy was performed between the second and third  tracheal rings.  A #6 cuffed Shiley tube was inserted after removing the  endotracheal tube without any difficulty.  This was secured to the neck with  2-0 silk sutures x4 and Velcro trach holder.  This completed the procedure.  [name] was awakened and transferred back to the intensive care unit  postoperatively.           ______________________________  Kristine Garbe Ezzard Standing,  M.D.     CEN/MEDQ  D:  08/29/2005  T:  08/29/2005  Job:  [id number]

## 2010-08-19 NOTE — Consult Note (Signed)
[name], BOER NO.:  [id number]   MEDICAL RECORD NO.:  [id number]          PATIENT TYPE:  INP   LOCATION:  2104                         FACILITY:  [facility]   PHYSICIAN:  Antonietta Breach, M.D.  DATE OF BIRTH:  [birth year]   DATE OF CONSULTATION:  09/04/2005  DATE OF DISCHARGE:                                   CONSULTATION   CONSULTATION FOLLOW UP:  Ms. Roshini has had her eyes open today and can  focus on the examiner briefly, but otherwise she is not able to follow any  commands.  She continues to be unable to tolerate weaning off the  ventilator.  For agitation, she has required p.r.n.'s of Haldol 5 mg at 1300  as well as Ativan 2 mg twice at 0800 and 1400.   She has no extrapyramidal symptoms or abnormal movement.  It is noted that  her agitation level has been reduced.   Her white blood cell count is 13.2 with a hemoglobin of 10.6, her glucose is  126.   She has a temperature of 100.8 and blood cultures are pending.  Blood  pressure 134/78, pulse 112.   ASSESSMENT:  1.  Delirium, not otherwise specified.  2.  History of schizoaffective disorder.   The patient is tolerating the medication changes.  Recommend to decrease  Klonopin to 2 mg per tube t.i.d. to reduce the cognitive dulling and  potential impaired memory and promotion of delirium that Klonopin can cause,  and increase Seroquel to 50 mg per tube b.i.d. and 75 mg q.h.s. to help  reduce her delirium symptoms as well as agitation.   Would draw a Depakote level to rule out excess Depakote possibly  contributing to sedation but continue 500 mg t.i.d. for now for her primary  mood stabilizer in the context of schizoaffective disorder and to help with  acute agitation.      Antonietta Breach, M.D.  Electronically Signed     JW/MEDQ  D:  09/10/2005  T:  09/11/2005  Job:  [id number]

## 2010-08-19 NOTE — Discharge Summary (Signed)
[name], [name]               ACCOUNT NO.:  [id number]   MEDICAL RECORD NO.:  [id number]          PATIENT TYPE:  INP   LOCATION:  1405                         FACILITY:  [facility]   PHYSICIAN:  Melissa L. Ladona Ridgel, MD  DATE OF BIRTH:  [birth year]   DATE OF ADMISSION:  09/29/2005  DATE OF DISCHARGE:  10/06/2005                                 DISCHARGE SUMMARY   CHIEF COMPLAINT ON ADMISSION:  Lower extremity pain and edema.   DISCHARGE DIAGNOSES:  1.  Left lower extremity cellulitis, resolved - status post treatment with      intravenous Ancef, will complete a full course of Keflex.  2.  Urinary tract infection with an Escherichia coli sensitive to      cephalosporins.  3.  Bipolar disease.  The patient will continue with her home medications      and follow up as an outpatient with her psychiatrist.  Her Seroquel has      been increased at bedtime to 200 mg once daily.  4.  Obstructive sleep apnea with trach intact.  The patient will continue      with regular trach care as previously prescribed.  5.  Gastroesophageal reflux disease.  The patient will continue with her      Nexium.  6.  Lower extremity edema, chronic.  The patient will be discharged to home      on Lasix with oral potassium.  She will need followup laboratories every      week.  7.  Sinus tachycardia, likely secondary to her underlying pulmonary      condition.  I have up titrated her Lopressor to 37.5 mg twice daily      which she seems to tolerate.  8.  Mild iron-deficiency anemia.  She will continue with her ferrous sulfate      and I would recommend Colace 100 mg twice daily, holding for loose      stools.   DISCHARGE MEDICATIONS:  1.  Ferrous sulfate 325 mg twice daily.  2.  Colace 100 mg twice daily, hold for loose stools.  3.  Valproic acid 500 mg t.i.d.  4.  Resume Nexium at 40 mg once daily.  5.  Keflex 500 mg four times daily x4 more day.  6.  Lasix 40 mg once daily.  7.  Potassium 40 mEq once  daily.  8.  Lopressor 37.5 mg twice daily.  9.  Seroquel 100 mg at 10 a.m. and 100 mg at 6 p.m.  10. Seroquel 200 mg at bedtime.  11. Klonopin 0.5 mg at bedtime.   The patient will need to have a post hospital visit with her psychiatrist as  well as her primary care physician in the next 1-2 weeks.   HOSPITAL COURSE:  The patient is a 30 year old African American female with  a past medical history for morbid obesity, status post tonsillectomy,  adenoidectomy with complications resulting in vent-dependent respiratory  failure.  The patient subsequently has required tracheostomy.  She suffered  during the course of that hospital stay a staph UTI and anemia, was  discharged on  September 19, 2005.  The patient returned to her group home and  came back to the hospital with lower extremity swelling as well as some  weakness.  She was brought to the emergency room after being seen at  [facility].  She was admitted to the hospital with what appeared to be a  left lower extremity cellulitis and started on IV antibiotic therapy.  The patient was admitted to the telemetry floor, started on IV Ancef, and  continued on her home medications for her bipolar disease.  The patient was  discovered to have a urinary tract infection which was covered with the same  antibiotics used for her lower extremity and therefore this was continued  since she seemed to be making progress on that.  A physical therapy consult  was made and the patient did progress somewhat with PT.  Of note, the  patient had a lower extremity Doppler studies, which showed no obvious  evidence for venous DVT.  During the course of the hospital stay, the  patient's trach was cared for by RT.  She displayed the ability to care to a  degree for her Passy-Muir valve and did not appear to be in distress or have  increased secretions during the course of the stay.  Initially, it was felt  that the patient would not be able to return to her group  home, however, at  this time she appears to be tolerating treatment well and has been accepted  back into their care.  We have up titrated some of her medications to assist  in her schizoaffective disorder.  The patient appears calms and comfortable  today.  I am prepared to return her to her home care setting.   PHYSICAL EXAMINATION:  VITAL SIGNS:  On the day of discharge, the patient's  vital signs have remained stable.  Her blood pressure is 125/67.  Her heart  rate ranges from 108-114, depending on her exertional state.  Her  respiratory rate is 22-24 with a saturation of 100% on room air.  GENERAL:  This is a morbidly obese African American female in no acute  distress.  HEENT:  She is normocephalic atraumatic.  Pupils are equal, round, reactive  to light.  Extraocular muscles appear to be intact.  Her trach is intact,  appears to be clean dry with minimal erythema.  CHEST:  Decreased but clear to auscultation with no rhonchi, rales, or  wheezes.  CARDIOVASCULAR:  Regular rate and rhythm.  With exertion, she does become  tachycardic.  ABDOMEN:  Obese, nontender, nondistended with positive bowel sounds.  EXTREMITIES:  Show 1+ to 2 edema which has improved somewhat with the Lasix  which I will continue.  Power is 5/5.  Gait is intact.   DISCHARGE LABORATORY VALUES:  Reveal the last white count on October 04, 2005,  of 10.9, hemoglobin of 9.6, hematocrit 29.5, platelets of 273.  Her BUN is  6, creatinine is 0.6 with a sodium of 140 and a potassium of 3.7.  I would  recommend rechecking the basal metabolic panel next week.  Please note that  the patient does have greater than 100,000 E. coli which were sensitive to  cefazolin and therefore, she has been continued on Keflex.  She does have a  urine culture which is growing 20,000 enterococcus.  The sensitivities have  not returned on this but this likely is colonization and not symptomatic at this time; I therefore will not change her  therapy.  I  will follow up,  however, to determine whether this is vancomycin resistant or not.   At this time, the patient is deemed stable for discharge back to the group  home setting.  She is to follow up with [facility] this week as well as her  psychiatrist.   Addendum: Please note the patient's urine culture returned with VRE.  A call  was placed to the patient's care giver.  The patient is asymptomatic and  will not require treatment for this colonizing organism.  I have advised the  patient's care giver to allow patient to use a designated bathroom for her  own use and to use gloves when cleaning up after the client.  MLT      Melissa L. Ladona Ridgel, MD  Electronically Signed     MLT/MEDQ  D:  10/06/2005  T:  10/06/2005  Job:  [id number]

## 2010-08-19 NOTE — Consult Note (Signed)
[name], [name] NO.:  [id number]   MEDICAL RECORD NO.:  [id number]          PATIENT TYPE:  INP   LOCATION:  3038                         FACILITY:  [facility]   PHYSICIAN:  Antonietta Breach, M.D.  DATE OF BIRTH:  Nov 05, 1980   DATE OF CONSULTATION:  09/01/2005  DATE OF DISCHARGE:  09/20/2005                                   CONSULTATION   REASON FOR CONSULTATION:  Severe agitation.   HISTORY OF PRESENT ILLNESS:  [name] is a 30 year old female on Aug 17, 2005.  She has had severe, restrictive sleep apnea.  She required a trach on  Aug 29, 2005.  She has not been able to wean off of the ventilator due to  severe agitation which is interfering.  We were consulted to help with this.   The patient has been on multiple medications.  She has been receiving  several p.r.n. doses of Ativan, up to 14 mg so far on September 01, 2005.  She  also has been given 10 mg of Haldol today.  The Haldol has been scheduled  every four hours which has helped some but it has been unable to resolve a  significant amount of this severe agitation breakthrough.   PAST PSYCHIATRIC HISTORY:  There is a reported history of bipolar disorder  in the chart as well as attention deficit hyperactivity disorder.  There is  no definitive psychiatric history available.  The patient is not able to be  a historian.   FAMILY PSYCHIATRIC HISTORY:  None known.   RELIGION:  [employer].   SOCIAL HISTORY:  Marital status:  Single.  The patient resides in a group  home.  There is no known history of alcohol or illegal drug use.   GENERAL MEDICAL PROBLEMS:  Chronic obstructive pulmonary disease,  obstructive sleep apnea, and obesity.   MEDICATIONS:  The MAR is reviewed.  1.  Psychotropics include Abilify 30 mg daily.  2.  Tegretol 200 mg t.i.d.  3.  Klonopin 3 mg q.6h.  4.  Depakote has been added 500 mg t.i.d. on Aug 31, 2005.  5.  Haldol is 5 mg q.2h.  6.  Ativan 1 to 2 mg q.6h.   ALLERGIES:  No  known drug allergies.   LABORATORY DATA:  Hemoglobin 10.4, AST 38, ALT 66.   REVIEW OF SYSTEMS:  Constitutional:  Afebrile.  HEAD:  No trauma.  No known  visual changes.  There have been no new respiratory complications or  infection.  GASTROINTESTINAL:  By record, no history of nausea, vomiting, or  diarrhea.  GENITOURINARY:  No history of recent urinary tract infection.  SKIN:  Unremarkable.  MUSCULOSKELETAL:  No deformities, weakness, or  atrophy.  ENDOCRINE/METABOLIC:  There is severe obesity.  The above review  of systems is limited due to having been done by medical record review only.   EXAMINATION:  VITAL SIGNS:  Temperature 99.5, pulse 132, respiration 20,  blood pressure 110/55.  MENTAL STATUS EXAM:  [name] has her eyes open.  She is not able to  verbally communicate due to her trach status  but her affect is agitated.  She does not appear to be responding to internal stimuli.   ASSESSMENT:   AXIS I:  Delirium, not otherwise specified, 293.00  Mood disorder, not otherwise specified (history of functional bipolar  disorder by record.  She also has some general medical factors), 293.83.   AXIS II:  MR, by history.   AXIS III:  See general medical problems.   AXIS IV:  General medical.   AXIS V:  20.   RECOMMENDATIONS:  1.  With the possibility that Abilify could be contributing to akathisia,      would stop the Abilify and place the patient on standing Seroquel.  2.  Would increase the Depakote to 750 mg t.i.d. for mood stabilization.  3.  Would also try reducing Ativan p.r.n. as it may be contributing to      disinhibition and clouding consciousness.  4.  Would monitor for extra __________ side effects and administer Cogentin      1 to 2 mg b.i.d. if EPS occurs.      Antonietta Breach, M.D.  Electronically Signed     JW/MEDQ  D:  10/15/2005  T:  10/16/2005  Job:  [id number]

## 2010-08-19 NOTE — H&P (Signed)
NAMENOAM, FRANZEN NO.:  [id number]   MEDICAL RECORD NO.:  [id number]          PATIENT TYPE:  IPS   LOCATION:  4144                         FACILITY:  [facility]   PHYSICIAN:  Ranelle Oyster, M.D.DATE OF BIRTH:  Dec 27, 1980   DATE OF ADMISSION:  09/20/2005  DATE OF DISCHARGE:                                HISTORY & PHYSICAL   PRIMARY CARE PHYSICIAN:  Jillyn Hidden A. Rankin, M.D.   SERVICE:  Critical Care Medicine.   CONSULTING PHYSICIAN:  Dr. Ezzard Standing.   CHIEF COMPLAINT:  Deconditioning, shortness of breath and weakness.   HISTORY OF PRESENT ILLNESS:  This is a 30 year old black female with a  history of mental retardation and obstructive sleep apnea using a CPAP  intermittently at home, the patient also suffers with morbid obesity, who  was admitted on May 17 for treatment of adenotonsillar hypertrophy with  obstructive symptoms. The patient underwent T&A and bilateral turbinate  reduction by Dr. Ezzard Standing on May 17. The patient had a postoperative course  complicated by vent dependent respiratory failure and pulmonary edema. The  patient had a trache placed May 29 prior to extubation. The patient also was  noted to have one positive coag negative staph blood culture and an E. coli  UTI which was treated with Maxipime from May 25 to September 08, 2005. The patient  was seen by Dr. Jeanie Sewer for agitation and delirium. She was placed on  Depakote 500 mg t.i.d. as a mood stabilizer as well as Seroquel 100 mg  t.i.d. for any psychosis. The patient continues with trache and is using a  PMV valve for speech. [place]el is recommended long-term until patient looses  significant weight. The patient to have difficulties with mobility and self  care and it was decided to bring her to rehab to further her functional  abilities.   REVIEW OF SYSTEMS:  Positive for insomnia, anxiety, decreased energy, sleep,  reflux. She denies pain. She did have some wheezing and shortness of breath  particularly with exertion.   PAST MEDICAL HISTORY:  Positive for mental retardation, obstructive sleep  apnea, morbid obesity, bipolar disorder with schitzoeffective components.   FAMILY HISTORY:  Positive for HIV and questionable renal disease.   SOCIAL HISTORY:  The patient lives in a group home which was supervision  independent prior to arrival. She had a CAPS worker through [employer].  She does not drink or smoke. She lives in a one-level house with five steps  to enter.   FUNCTIONAL HISTORY:  The patient states that she was independent and could  walk essentially an unlimited distance prior to her hospitalization.   ALLERGIES:  None.   HOME MEDICATIONS:  1.  Abilify 30 mg a day.  2.  Concerta 36 mg 2 tabs a day.  3.  Equetro 200 mg q.a.m. and p.m.  4.  Hydrochlorothiazide 12.5 mg daily.  5.  Depo-Provera q.3 months.   PHYSICAL EXAMINATION:  Blood pressure is 134/82, pulse is 109, respiratory  rate 22, temperature 97.9. The patient is somewhat flat, and in no acute  distress. She remains morbidly obese. She  is wearing her trache. She is able  to speak very clearly through her tache today.  HEENT:  Pupils equal round and reactive to light. Extraocular eye movements  are intact. Ear, nose and throat exam unremarkable except for her trache.  NECK:  Supple without JVD or lymphadenopathy.  CHEST:  Reveals some decreased breath sounds at the bases and some upper  sounds around the throat otherwise clear to auscultation.  HEART:  Regular rate and rhythm without murmurs, rubs or gallops.  EXTREMITIES:  Show no clubbing or cyanosis and trace edema at the ankles and  feet.  ABDOMEN:  Soft, nontender, bowel sounds are positive.  NEUROLOGIC:  The patient has intact cranial nerve exam. Reflexes are 1+.  Sensation is normal. Judgment, orientation and memory were intact for simple  questioning and command following. She has decreased awareness for more  complex thoughts and  situations. Affect was a bit flat but patient did  respond to cueing and did smile appropriately at times. Motor function was  3+ to 4/5, proximal in the upper extremities. She is 4+/5 distally in the  upper extremities. Bilateral lower extremities are 2+ to 3/5 proximally to  4/5 distally.   ASSESSMENT/PLAN:  1.  Functional deficit secondary to deconditioning plus multiple medical      complications. Begin comprehensive inpatient rehab with PT to evaluate      and treat mobility and lower extremity strengthening. OT will assess for      upper extremity use and ADLs. Speech will follow for swallowing and any      cognitive issues. 24-hour rehab nursing will monitor patient closely for      bowel, bladder, skin, medication and oxygenation. Rehab case      manager/social worker will assess for psychosocial needs and      disposition. Estimated length of stay is 3 weeks. Goal is supervision.      Prognosis fair.  2.  Pain management p.r.n. Tylenol.  3.  DVT prophylaxis subcu Lovenox.  4.  Bipolar disorder:  Continue Depakote and Seroquel for now.  5.  Tachycardia:  Lopressor.  6.  Reflux disease:  Nexium.  7.  Obstructive sleep apnea:  Continue humidified air. The patient will have      trache in the for the near future until significant weight loss is      achieved.  8.  Attention/arousal:  Continue to hold Concerta for now due to history of      psychosis.  9.  Acute blood loss anemia:  Iron supplement.  10. Hyperglycemia:  Will monitor her CBGs for now.  11. Bowels:  Monitor bowel schedule with nurses help. Supplement schedule as      needed.      Ranelle Oyster, M.D.  Electronically Signed     ZTS/MEDQ  D:  09/20/2005  T:  09/20/2005  Job:  [id number]   cc:   Jillyn Hidden A. Rankin, M.D.  Fax: [phone]

## 2010-08-19 NOTE — Op Note (Signed)
[name], [name] NO.:  [id number]   MEDICAL RECORD NO.:  [id number]          PATIENT TYPE:  OIB   LOCATION:  2104                         FACILITY:  [facility]   PHYSICIAN:  Kristine Garbe. Ezzard Standing, M.D.DATE OF BIRTH:  Feb 08, 1981   DATE OF PROCEDURE:  08/17/2005  DATE OF DISCHARGE:                                 OPERATIVE REPORT   PREOPERATIVE DIAGNOSES:  1.  Adenotonsillar hypertrophy with obstructive symptoms.  2.  Turbinate hypertrophy with obstructive symptoms.  3.  History of obstructive sleep apnea.   POSTOPERATIVE DIAGNOSES:  1.  Adenotonsillar hypertrophy with obstructive symptoms.  2.  Turbinate hypertrophy with obstructive symptoms.  3.  History of obstructive sleep apnea.   OPERATION PERFORMED:  1.  Tonsillectomy and adenoidectomy.  2.  Bilateral hypertrophied turbinate reduction.   SURGEON:  Kristine Garbe. Ezzard Standing, M.D.   ANESTHESIA:  General endotracheal.   COMPLICATIONS:  None.   BRIEF CLINICAL NOTE:  [name] is a 30 year old female who has  obstructive sleep apnea for which she uses home nasal CPAP.  She has very  loud snoring with severe oxygen desaturation.  She is significantly  overweight with severe obstructive sleep apnea.  On clinical exam she has  very large tonsils.  She really does not tolerate her nasal CPAP well; and  she is brought to the operating room at this time for a tonsillectomy,  adenoidectomy and turbinate reductions  to help to see if this improves her  sleep apnea, and, as well, will allow her to tolerate her nasal CPAP better.   I discussed with [name] as well as with her caregiver that she will still  have sleep apnea and will require treatment with nasal CPAP following  surgery.  I discussed with them also the risks of tonsillectomy concerning  morbidity associated with pain and the risks of postop bleeding.   DESCRIPTION OF THE OPERATION:  After adequate endotracheal anes the nose was  examined first.   The patient had large turbinates bilaterally.  Bipolar  submucosal cauterization of the turbinates was performed and the turbinate  bone was outfractured bilaterally.  Papaverine packs were placed for  hemostasis.  This completed the turbinate reductions.   Following this a mouth gag was used to exposed the oropharynx.  The left and  right tonsils were dissected from the tonsillar fossae using the cautery.  Hemostasis was obtained with the cautery.  The patient had a long uvula and  the lower two-thirds of the uvula was amputated after removing the tonsils.  This completed the tonsillectomy.   The red rubber catheter was then passed through the nose and out the mouth  to retract the soft palate.  The nasopharynx was examined.  [name] had just  a moderate amount of adenoid tissue.  Suction cautery was used to cauterize  the central pad of adenoid tissue that remained.  This completed the  procedure.   The nose and nasopharynx were irrigated with saline.   [name] was awakened from anesthesia and transferred to recovery room postop  doing well.   DISPOSITION:  [name] will be [facility] overnight  in [facility]  and discharged home on amoxicillin suspension 500 mg twice a day for one  week and Tylenol or Lortab elixir 1-1.5 T q.4 hours p.r.n. pain.  She will  follow up in my office in two weeks for recheck.           ______________________________  Kristine Garbe. Ezzard Standing, M.D.     CEN/MEDQ  D:  08/17/2005  T:  08/18/2005  Job:  [id number]   cc:   Fanny Dance. Rankins, M.D.  Fax: [phone]

## 2010-08-19 NOTE — Discharge Summary (Signed)
NAMELOUIZA, MOOR NO.:  [id number]   MEDICAL RECORD NO.:  [id number]          PATIENT TYPE:  IPS   LOCATION:  4144                         FACILITY:  [facility]   PHYSICIAN:  Ellwood Dense, M.D.   DATE OF BIRTH:  1980/10/20   DATE OF ADMISSION:  09/20/2005  DATE OF DISCHARGE:  09/26/2005                                 DISCHARGE SUMMARY   DISCHARGE DIAGNOSES:  1.  Deconditioning secondary to VDRF, past tonsilloadenoidectomy with      bilateral turbinate reduction.  2.  Bipolar disorder with schizoaffective features.  3.  Tachycardia.  4.  Gastroesophageal reflux disease.  5.  Morbid obesity.  6.  Obstructive sleep apnea.   HISTORY OF PRESENT ILLNESS:  Miss Vessel is a 30 year old female with  history of MRSA with CPAP use and morbid obesity admitted Aug 17, 2005 for  treatment of tonsillar hypertrophy with obstructive symptoms.  The patient  underwent tonsillectomy and adenoidectomy with bilateral turbinate reduction  by Dr. Narda Bonds.  Postop was complicated by VDRF, secondary to pulmonary  edema, requiring tracheostomy on Aug 29, 2005 prior to extubation.  The  patient also was noted to have one positive, one growing staph blood culture  and E. Coli UTI, treated with IV Maxipime.  Dr. Jeanie Sewer was consulted.  The patient with agitation and delirium.  He recommended using Depakote for  mood stabilizer and Seroquel for antipsychotic.  Follow up with mental  health service passed discharge.  Critical care medicine recommended trache  indefinitely for obstructive sleep apnea, which is bad.  The patient  currently will tolerate Passy-Muir valve and capping.  Therapy is initiated  and the patient was noted to have weakness of bilateral extremities with  buckling and environmental self care.  Rehab consult for further therapy.   PAST MEDICAL HISTORY:  Significant for MR, obstructive sleep apnea, morbid  obesity, bipolar disorder with schizoaffective  features.   ALLERGIES:  No known drug allergies.   FAMILY HISTORY:  Positive for HIV and questionable renal disease.   SOCIAL HISTORY:  The patient lives in a group home with distant supervision  level.  She lives in a one-level home with 5 steps at entry.  Does not use  any tobacco or alcohol.  The patient was independent prior to admission.   HOSPITAL COURSE:  Miss Brendi Mccarroll was admitted to rehab on September 20, 2005  for inpatient therapy, to consistent of PT/OT.  Last admission, the  patient's blood pressures were monitored on b.i.d. basis.  She has continued  with tachycardia with heart rates from 90s to 110.  Blood pressures were all  from 100 to 130 systolic, at 70 diastolic.  [name] O2 sat have ranged  from 92 to 98% on room air with positive plugging with Passy-Muir valve.   Labs on the past admission, showed the patient's anemia to be stable with  hemoglobin 10.1, hematocrit 30.6, white count 11.5, platelets 245,000.  Electrolytes reveals sodium 135, potassium 3.8, chloride 100, CO2 28, BUN 6,  creatinine 0.7, glucose 82.  LFTs with some elevation with AST at 63,  ALT  90.  Hemoglobin A1c was noted to be elevated at 6.4.  The patient was  maintained on carb-modified diet, which initially ranged from 90s to  occasionally high in the 130s.   The patient has been instructed regarding dietary discretion and also  regarding weight loss.  She has been attending outpatient nutrition center  is to continue with them passed discharge.  [name] mood has been  stable on valproic acid 500 mg every 8 hours, Seroquel 100 mg every 8 hours,  Klonopin 0.5 mg nightly.  The patient continues on this currently.  She is  to follow up with her psychiatrist regarding further adjustments of meds.   The patient has been continent of bowel and bladder.  She has been resistant  to using humidified J-collar at night.  Initially, she was also hesitant,  regarding having her caregiver do  trachea; however, on discharge, the  patient was agreeable to have caregiver drain regarding trache care and  trache management.  [facility] is also set up to include home  health, PT/RN passed discharge.  Of note, the patient did receive a dose of  Depo-Medrol 150 mg IM on September 25, 2005.  She was also noted to have E. coli  UTI and was treated with Keflex for 7 total days of antibiotic therapy.   During her stay in rehab, Miss Schnyder has progressed along well.  She is  currently at supervision level without assistive device for transfers and  ambulating 20 feet x2.  She is at supervision level to navigate two stairs.  Today, supervision is available passed discharge.   DISCHARGE MEDICATIONS:  Keflex 500 mg p.o. q.i.d., Klonopin 0.5 mg nightly,  ferrous sulfate 325 mg b.i.d., Lopressor 50 mg 1/2 p.o. b.i.d., Seroquel 100  mg every 8 hours, valproic acid 500 mg every 8 hours, Nexium 40 mg daily, K-  Dur 20 mEq daily, hydrochlorothiazide 25 mg 1/2 to 1 p.o. p.r.n.   Diet is low fat, weight reduced.  Wound care:  Continue routine trache care  with cleaning of in and out trache cannula daily.  Activities as tolerated  with 24-hour supervision.  No alcohol, no smoking.  The patient instructed  not to use Concerta, ____________.  Followup:  The patient to follow up with  Dr. Ezzard Standing for postop checks.  Follow up with Dr. Beverley Fiedler for  routine check, follow up with Dr. Theophilus Bones in [facility] for further  medications changes, follow up with Dr. Lamar Benes as needed.      Greg Cutter, P.A.    ______________________________  Ellwood Dense, M.D.    PP/MEDQ  D:  09/26/2005  T:  09/26/2005  Job:  [id number]   cc:   Fanny Dance. Rankins, M.D.  Fax: [phone]   Kristine Garbe. Ezzard Standing, M.D.  Fax: [phone]   Lonny Prude  Fax: [phone]   Dr. Theophilus Bones  [phone]

## 2010-08-19 NOTE — Consult Note (Signed)
NAMESHANICE, [name] NO.:  [id number]   MEDICAL RECORD NO.:  [id number]          PATIENT TYPE:  INP   LOCATION:  1405                         FACILITY:  [facility]   PHYSICIAN:  Antonietta Breach, M.D.  DATE OF BIRTH:  01-13-1981   DATE OF CONSULTATION:  10/05/2005  DATE OF DISCHARGE:                                   CONSULTATION   REASON FOR CONSULTATION:  Followup.   HISTORY OF PRESENT ILLNESS:  [name] has been having some expansive  mood, asking to leave the hospital, threatening a man. She is not having any  adverse psychotropic effects. On examination, she is oriented to all  spheres, thought processes mildly fast. Her affect is mildly expansive.  Judgment is impaired. Insight is poor.   Her metabolic panel is unremarkable. Recent liver function tests show an  SGOT at 23, SGPT 19. On October 04, 2005, her CBC was unremarkable except for a  stable decreased hemoglobin at 9.6.   PHYSICAL EXAMINATION:  VITAL SIGNS:  Temperature 97.9, pulse 114,  respiratory rate 18, blood pressure 132/78. O2 saturation 93% on room air.   ASSESSMENT:  1.  Schizoaffective disorder (295.7)  2.  Manic greatly improved in impartial remission.  3.  Mental retardation.   IMPRESSION:  [name] lack of capacity for informed consent is due to  her mental retardation. This is stable.   RECOMMENDATIONS:  1.  Would increase her Seroquel to 100 mg b.i.d. and 200 mg q.h.s.  2.  No change in her Depakote 500 mg t.i.d., is the primary mood stabilizer.  3.  Would also have as a p.r.n., Haldol 4 mg with Ativan 2 mg p.o., IM, or      slow push t.i.d. p.r.n. available.      Antonietta Breach, M.D.  Electronically Signed     JW/MEDQ  D:  10/05/2005  T:  10/05/2005  Job:  [id number]

## 2010-08-19 NOTE — Consult Note (Signed)
NAMELILLYAUNA, JENKINSON NO.:  [id number]   MEDICAL RECORD NO.:  [id number]          PATIENT TYPE:  INP   LOCATION:  2104                         FACILITY:  [facility]   PHYSICIAN:  Antonietta Breach, M.D.  DATE OF BIRTH:  1981-03-27   DATE OF CONSULTATION:  09/06/2005  DATE OF DISCHARGE:                                   CONSULTATION   HISTORY OF PRESENT ILLNESS:  Ms. Walla does show psychomotor agitation, but  this has been reduced.  The number of p.r.n.'s for agitation has also been  reduced.  She has been on the tracheostomy collar today for approximately 3  hours with success.   PHYSICAL EXAMINATION:  GENERAL:  She, on exam, is calm and not agitated.  She appears to be tolerating the tracheostomy collar well.  VITAL SIGNS:  Temperature 98.9, pulse 98, respirations 30, blood pressure  112/52.   ASSESSMENT:  1.  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  2.  Delirium, improved.   RECOMMENDATIONS:  Continue the Depakote 500 mg p.o. t.i.d. as a primary mood  stabilizer.  Seroquel continued for now.  Use Klonopin dosing cautiously to  avoid excess sedation, but also to help relieve feeling on edge, as the  patient weans.      Antonietta Breach, M.D.  Electronically Signed     JW/MEDQ  D:  09/12/2005  T:  09/12/2005  Job:  [id number]

## 2010-08-19 NOTE — Procedures (Signed)
[name], [name]               ACCOUNT NO.:  [id number]   MEDICAL RECORD NO.:  [id number]          PATIENT TYPE:  OUT   LOCATION:  SLEEP CENTER                 [facility]ACILITY:  [facility]   PHYSICIAN:  Clinton D. Maple Hudson, M.D. DATE OF BIRTH:  04-Jul-1980   DATE OF STUDY:  01/15/2005                              NOCTURNAL POLYSOMNOGRAM   REFERRING PHYSICIAN:  Turkey R. Rankins, M.D.   DATE OF STUDY:  January 15, 2005.   INDICATION FOR STUDY:  Hypersomnia with sleep apnea.  Weight 310 pounds.   EPWORTH SLEEPINESS SCORE:  18/24.   MEDICATIONS:  Stool softener, Abilify, Concerta, fluoxetine, HCTZ, Nexium.   SLEEP ARCHITECTURE:  Total sleep time 428 minutes with sleep efficiency 94%.  Stage 1 was 1%, stage 2 70%, stages 3 and 4 18%.  REM 11% of total sleep  time.  Sleep latency 2 minutes.  REM latency 234 minutes.  Awake after sleep  onset 26 minutes.  Arousal index 61.  No bedtime medication was reported.   RESPIRATORY DATA:  Split study protocol.  Apnea hypopnea index (AHI, RDI)  164.9 obstructive events per hour indicating very severe obstructive sleep  apnea/hypopnea syndrome before C-PAP.  This included 203 obstructive apneas,  131 hypopneas before C-PAP.  Most sleep and therefore most events were  recorded while supine but significant numbers of events were also recorded  while sleeping on her right side indicating this is nonpositional.  REM AHI  55.7 per hour.  C-PAP titration to 22 CWP provided an AHI of 0 per hour.  A  full-face Ultramirage mask, size small, was used with heated humidifier and  supplemental oxygen.   OXYGEN DATA:  Snoring was very loud before C-PAP.  Oxygen desaturated to a  nadir of 50% and oxygen was added at 2 liters per minute.  Oxygenation is  not determined on final C-PAP pressure alone and can be reassessed by  overnight oxymetry while on C-PAP at home if appropriate.  Oxygen saturation  held 92 to 96% on C-PAP at therapeutic pressures plus oxygen at 2  liter per  minute.   CARDIAC DATA:  Normal sinus rhythm with some sinus tachycardia, 95-111 beats  per minute.   MOVEMENT-PARASOMNIA:  Bathroom x2.  Occasional leg jerk with probably  insignificant effect on sleep, movement arousal index 2.1 per hour.   IMPRESSIONS-RECOMMENDATIONS:  1.  Very severe obstructive sleep apnea/hypopnea syndrome, AHI 164.9 per      hour with very loud snoring and severe oxygen desaturation before C-PAP.  2.  Successful C-PAP titration to 22 CWP using a small full-face Ultramirage      mask with heated humidifier and adding oxygen at 2 liters per minute.      Consider starting home C-PAP/Bi-PAP without oxygen, following soon      with overnight oxymetry on C-PAP to determine whether supplemental      oxygen will also be required.  3.  Therapy should also include effort at weight loss.      Clinton D. Maple Hudson, M.D.  Diplomate, Biomedical engineer of Sleep Medicine  Electronically Signed     CDY/MEDQ  D:  01/28/2005 16:33:52  T:  01/28/2005  20:33:41  Job:  [id number]

## 2010-08-19 NOTE — Consult Note (Signed)
NAMEKERISSA, COIA NO.:  [id number]   MEDICAL RECORD NO.:  [id number]          PATIENT TYPE:  INP   LOCATION:  3101                         FACILITY:  [facility]   PHYSICIAN:  Antonietta Breach, M.D.  DATE OF BIRTH:  Aug 18, 1980   DATE OF CONSULTATION:  09/13/2005  DATE OF DISCHARGE:                                   CONSULTATION   Ms. Gombos is off the ventilator.  She does not have any agitation, and she  is able to verbalize some.  She reports being fatigued, and she falls back  to sleep easily.  She is not having any adverse psychotropic effect.  There  are no hallucinations or delusions.  She is tolerating her trach collar  well, and she has actually been up to the chair at times.   Psychotropic medications continue to be Depakote 500 mg p.o. t.i.d. for  antiagitation, mood stabilization, Klonopin 0.5 mg q.h.s. for antiacute  anxiety, Seroquel 25 mg b.i.d. for antipsychosis, agitation.   ASSESSMENT:  1.  Delirium , not otherwise specified, stable.  2.  Mood disorder, not otherwise specified.  3.  The patient has a history of schizo-affective disorder, by report.   RECOMMENDATIONS:  Continue Depakote at 500 mg p.o. t.i.d. as the primary  mood stabilizer, as the patient finally weans, taper off the Klonopin.  If  the patient continues to be psychiatrically stable, will anticipate  outpatient psychiatric followup at one of the [facility] local  clinics or the county mental health center.      Antonietta Breach, M.D.  Electronically Signed     JW/MEDQ  D:  09/17/2005  T:  09/18/2005  Job:  [id number]

## 2010-09-29 ENCOUNTER — Emergency Department (HOSPITAL_COMMUNITY)
Admission: EM | Admit: 2010-09-29 | Discharge: 2010-09-29 | Disposition: A | Discharge status: Home / Self Care | Payer: Medicaid Other | Attending: Emergency Medicine | Admitting: Emergency Medicine

## 2010-09-29 DIAGNOSIS — F259 Schizoaffective disorder, unspecified: Secondary | ICD-10-CM | POA: Insufficient documentation

## 2010-09-29 DIAGNOSIS — F410 Panic disorder [episodic paroxysmal anxiety] without agoraphobia: Secondary | ICD-10-CM | POA: Insufficient documentation

## 2010-09-29 DIAGNOSIS — F3289 Other specified depressive episodes: Secondary | ICD-10-CM | POA: Insufficient documentation

## 2010-09-29 DIAGNOSIS — E119 Type 2 diabetes mellitus without complications: Secondary | ICD-10-CM | POA: Insufficient documentation

## 2010-09-29 DIAGNOSIS — I10 Essential (primary) hypertension: Secondary | ICD-10-CM | POA: Insufficient documentation

## 2010-09-29 DIAGNOSIS — F329 Major depressive disorder, single episode, unspecified: Secondary | ICD-10-CM | POA: Insufficient documentation

## 2010-09-29 DIAGNOSIS — Z139 Encounter for screening, unspecified: Secondary | ICD-10-CM | POA: Insufficient documentation

## 2010-09-29 DIAGNOSIS — K219 Gastro-esophageal reflux disease without esophagitis: Secondary | ICD-10-CM | POA: Insufficient documentation

## 2010-09-29 DIAGNOSIS — F79 Unspecified intellectual disabilities: Secondary | ICD-10-CM | POA: Insufficient documentation

## 2011-04-18 ENCOUNTER — Ambulatory Visit (HOSPITAL_BASED_OUTPATIENT_CLINIC_OR_DEPARTMENT_OTHER): Payer: Medicaid Other | Attending: Internal Medicine | Admitting: General Practice

## 2011-04-18 VITALS — Ht 64.0 in | Wt 365.0 lb

## 2011-04-18 DIAGNOSIS — G4733 Obstructive sleep apnea (adult) (pediatric): Secondary | ICD-10-CM | POA: Insufficient documentation

## 2011-05-02 DIAGNOSIS — G4733 Obstructive sleep apnea (adult) (pediatric): Secondary | ICD-10-CM

## 2011-05-03 NOTE — Procedures (Signed)
NAMEPETRONA, [name] NO.:  [id number]  MEDICAL RECORD NO.:  [id number]          PATIENT TYPE:  OUT  LOCATION:  SLEEP CENTER                 FACILITY:  [facility]  PHYSICIAN:  Barbaraann Share, MD,FCCPDATE OF BIRTH:  1980-11-12  DATE OF STUDY:  04/18/2011                           NOCTURNAL POLYSOMNOGRAM  REFERRING PHYSICIAN:  Fleet Contras, M.D.  REFERRING PHYSICIAN:  Sharlot Gowda, M.D.  INDICATIONS FOR STUDY:  Hypersomnia with sleep apnea.  EPWORTH SLEEPINESS SCORE:  6.  MEDICATIONS:  SLEEP ARCHITECTURE:  The patient had a total sleep time of 402 minutes with decreased slow-wave sleep for age and only 55 minutes of REM. Sleep onset latency was normal at 10 minutes, and REM onset was prolonged at 281 minute.  Sleep efficiency was mildly reduced at 87%.  RESPIRATORY DATA:  The patient was found to have 28 apneas and 54 obstructive hypopneas, giving her an apnea-hypopnea index of 12 events per hour.  The events occurred primarily in the supine position and during REM.  There was very loud snoring noted throughout.  The patient did not meet split night protocol secondary to the majority of her events occurring after 2 a.m.  OXYGEN DATA:  There was O2 desaturation as low as 78% with the patient's more severe, obstructive events.  CARDIAC DATA:  No clinically significant arrhythmias were noted.  MOVEMENTS-PARASOMNIA:  The patient had no significant leg jerks or other abnormal behavior seen.  IMPRESSION-RECOMMENDATION:  Mild obstructive sleep apnea/hypopnea syndrome with an apnea/hypopnea index of 12 events per hour and O2 desaturation as low as 78%.  Treatment for this degree of sleep apnea can include a trial of weight loss alone, upper airway surgery, dental appliance, and also CPAP.  Clinical correlation is suggested.     Barbaraann Share, MD,FCCP Diplomate, American Board of Sleep Medicine Electronically Signed    [facility]/MEDQ  D:  05/03/2011 08:21:17   T:  05/03/2011 09:28:06  Job:  [id number]

## 2012-03-12 ENCOUNTER — Encounter: Payer: Medicaid Other | Attending: Internal Medicine | Admitting: Dietician

## 2012-03-12 ENCOUNTER — Encounter: Payer: Self-pay | Admitting: Dietician

## 2012-03-12 VITALS — Ht 64.0 in | Wt 342.8 lb

## 2012-03-12 DIAGNOSIS — E119 Type 2 diabetes mellitus without complications: Secondary | ICD-10-CM

## 2012-03-12 DIAGNOSIS — Z713 Dietary counseling and surveillance: Secondary | ICD-10-CM | POA: Insufficient documentation

## 2012-03-12 NOTE — Progress Notes (Signed)
Medical Nutrition Therapy:  Appt start time: 1515 end time:  1630.  Assessment:  Primary concerns today: Wants to learn of some healthy choices other than wraps for eating. Recent diagnosis of Type 2 diabetes with and A1C of 6.7%.  Not currently checking blood glucose.  Has come to live with a new caregiver.  In her previous group home setting, she was having issues with eating large amounts and continued weight gain.  Currently she is the only client in this setting.  She and the caregiver note that she has behavioral issues and during the assessment, she loudly told the caregiver" she would answer the questions and that she did not need to provide input.  This appointment was for her and she would be answering the questions."  As the interview progressed, her tone became lower and she allowed the caretaker to ask questions and provide comments and answers.  Her history includes over eating, eating high calorie foods, seeking out food and getting up during the night and going to the kitchen and eating large amounts of foods in the middle of the night.  The caregiver reported that yesterday, she had purchased locks for the cabinets and the refrigerator to help prevent her from over eating and stealing foods.   Currently her weight is at 342.8 lb with a BMI of 59 kg/m2.  Since her last MD appointment 3 weeks ago, she has lost 13.2 lb.   She comes today following a 1200 calorie diet prescribed by her MD.  Her caregiver is about making sure that she gets only the 400 calories she is allowed at her three meals.  She verbalizes a concern "that when her family comes to visit her for the Christmas meal, she will need to feed Jilian prior to the family coming given that she will not be able to eat the foods that the family will be eating."  Advised her that she could take some liberty for the one meal and allow her to have the vegetables and the lean meat and to plan for her to have a sugar free dessert and to eat with  the family. The caregiver was under the impression that seeing me would be a one visit event.  I have ask that they return and perhaps I or one of my colleagues can assist with Paytin's weight loss.  BLOOD GLUCOSE MONITORING:  Not currently monitoring.  HYPERGLYCEMIA:  Gives no history of the S/S of high blood glucose.  HYPOGLYCEMIA:  Gives no history of the S/S of low blood glucose   MEDICATIONS: Med review completed.  Currently on no DM medications.   DIETARY INTAKE:  24-hr recall: MD placed on diet.  Recommended 1200 calories per day.   B ( AM): 8:30-9:00  Weekends (9:00-9:30)  Oatmeal, pack 1 pack, maple flavored, +/-  Banana 1/2 and water and 1/2 cup of orange juice.  OR grits,(one pack), and sometimes raisin toast.  And sometimes a cup of hot cocco.  Sunday eggs with cheese, beef sausage and raisin toast and 1/2 cup OJ  Snk ( AM): None  L ( PM): 12:00 Wrap (lettuce, [place], cheese and touch of Ranch dressing) and piece fruit or popcorn, or pecans. Water Snk ( PM): None D ( PM): 6:30-7:00 Spinach and golash, (rice and chopped chicken breast and mixed veggies and chicken broth). Snk ( PM): none Beverages: water, coco.    Usual physical activity: Currently activity varies, suppose to be walking with her 1:1 care giver at the  day program site.  From what she and the caregiver note, this does not appear to be an every day event.  The caregiver notes that on the weekend when it is not raining, they will walk at [facility].  Estimated energy needs: HT:64 in  WT: 342.8 lb  BMI: 59 kg/m2  Adj. WT:191 lb (87 kg) 1200-1300 calories  (will leave at the 1200, she needs to have adequate protein to sustain this loss without getting into issue of muscle loss. 30 gm carbohydrates at each meal 95-100 g protein 34-36 g fat  Progress Towards Goal(s):  In progress.   Nutritional Diagnosis:  Valdez-2.1 Inpaired nutrition utilization As related to blood glucose .  As evidenced by new diagnosis of type  2 diabetes with an A1C of 6.7%, accompanied by morbid obesity..    Intervention:  Nutrition Recommended the use of more of the non-starchy vegetables, lean meats and keeping the carb at 30 gm per meal.  Recommended using the calorieking.com web site.  Recommended using less of the prepared foods and to keep the fiber at 2-3+ gm per serving of breads, cereals, beans.  Recommended using oranges not the juice.  For calcium replacement, use a calcium supplement containing vitamin S and aim for 600 mg per day.  Consider the use of Crystal Light to flavor one of her bottles of water each day.if she is not using the carb at her meal time, Keryl Gholson have a snack to use that carb.  Continue to read food labels for portion size, fiber and sugar levels and carb counts.  Handouts given during visit include:  Living Well with Diabetes  Novo Nordisk Carb Counting Book  Food Label with recommendations  Menu of suggestions for 30 gm Carb per meal.  Monitoring/Evaluation:  Dietary intake, exercise, and body weight in 8-12 weeks.Marland Kitchen

## 2012-03-12 NOTE — Patient Instructions (Addendum)
   Aim for 30 gm of Carb per meal.  Try to use the fruit instead of the juice.  Use the calcium supplement instead of the orange juice with calcium in it. Calcium at 600 mg per day and has vitamin D in it Dr. Mitzie Na would be the MD to consult.  Use the old fashion for the oatmeal rather than the package and use a touch of salt and she can sweeten with the sugar substitute.  Try to keep the beverages in the sugar free category.  Can use the Crystal Light to sweeten water.  Try to limit to 1 packet per day.    If you don't use all of the carb at the [facility], you can have a snack and you need to have a protein with the snack.  With the total carb, you can subtract the fiber from the total carb.  Example:  Total Carb = 24 gm  Fiber = 5 gm   Count= 19 gm

## 2012-03-13 ENCOUNTER — Encounter: Payer: Self-pay | Admitting: Dietician

## 2012-03-14 ENCOUNTER — Emergency Department (HOSPITAL_COMMUNITY)
Admission: EM | Admit: 2012-03-14 | Discharge: 2012-03-14 | Disposition: A | Discharge status: Home / Self Care | Payer: Medicaid Other | Source: Home / Self Care | Attending: Emergency Medicine | Admitting: Emergency Medicine

## 2012-03-14 ENCOUNTER — Other Ambulatory Visit (HOSPITAL_COMMUNITY)
Admission: RE | Admit: 2012-03-14 | Discharge: 2012-03-14 | Disposition: A | Discharge status: Home / Self Care | Payer: Medicaid Other | Source: Ambulatory Visit | Attending: Emergency Medicine | Admitting: Emergency Medicine

## 2012-03-14 ENCOUNTER — Encounter (HOSPITAL_COMMUNITY): Payer: Self-pay | Admitting: Emergency Medicine

## 2012-03-14 DIAGNOSIS — N3000 Acute cystitis without hematuria: Secondary | ICD-10-CM

## 2012-03-14 DIAGNOSIS — N760 Acute vaginitis: Secondary | ICD-10-CM | POA: Insufficient documentation

## 2012-03-14 DIAGNOSIS — Z113 Encounter for screening for infections with a predominantly sexual mode of transmission: Secondary | ICD-10-CM | POA: Insufficient documentation

## 2012-03-14 LAB — POCT PREGNANCY, URINE: Preg Test, Ur: NEGATIVE

## 2012-03-14 MED ORDER — TERCONAZOLE 0.4 % VA CREA: 1.0000 | TOPICAL_CREAM | Freq: Every day | VAGINAL | Status: DC

## 2012-03-14 MED ORDER — NITROFURANTOIN MONOHYD MACRO 100 MG PO CAPS: 100.0000 mg | ORAL_CAPSULE | Freq: Two times a day (BID) | ORAL | Status: DC

## 2012-03-14 MED ORDER — METRONIDAZOLE 500 MG PO TABS: 500.0000 mg | ORAL_TABLET | Freq: Two times a day (BID) | ORAL | Status: DC

## 2012-03-14 MED ORDER — PHENAZOPYRIDINE HCL 200 MG PO TABS: 200.0000 mg | ORAL_TABLET | Freq: Three times a day (TID) | ORAL | Status: DC | PRN

## 2012-03-14 NOTE — ED Notes (Signed)
Reports vaginal pain for 3 months .  Denies discharge.  Reports pain with urination , burning.  Reports frequent urination

## 2012-03-14 NOTE — ED Provider Notes (Signed)
Chief Complaint  Patient presents with  . Vaginal Pain    History of Present Illness:   The patient is a 31 year old female who presents with a three-month history of dysuria, burning with urination, frequency, blood in urine, lower abdominal pain, and lower back pain. She also has had some itching and odor but denies any discharge. She is on Depo-Provera. Last menses were a month ago. She hasn't had any sexual activity in the last 6 years. She denies fever, chills, nausea, or vomiting. She's had no prior history of GYN problems or urinary tract infections. She does have a history of diabetes.  Review of Systems:  Other than noted above, the patient denies any of the following symptoms: Systemic:  No fever, chills, sweats, fatigue, or weight loss. GI:  No abdominal pain, nausea, anorexia, vomiting, diarrhea, constipation, melena or hematochezia. GU:  No dysuria, frequency, urgency, hematuria, vaginal discharge, itching, or abnormal vaginal bleeding. Skin:  No rash or itching.   PMFSH:  Past medical history, family history, social history, meds, and allergies were reviewed.  Physical Exam:   Vital signs:  BP 143/73  Pulse 75  Temp 98.3 F (36.8 C) (Oral)  Resp 16  SpO2 100% General:  Alert, oriented and in no distress. Lungs:  Breath sounds clear and equal bilaterally.  No wheezes, rales or rhonchi. Heart:  Regular rhythm.  No gallops or murmers. Abdomen:  Soft, flat and non-distended.  No organomegaly or mass.  No tenderness, guarding or rebound.  Bowel sounds normally active. Pelvic exam:  Normal external genitalia. She has a heavy, yellowish discharge was mildly malodorous. Cervix was not well seen due to poor patient cooperation. There was no pain on cervical motion. No uterine enlargement or tenderness. No adnexal tenderness or mass. Skin:  Clear, warm and dry.  Labs:   Results for orders placed during the hospital encounter of 03/14/12  POCT PREGNANCY, URINE      Component Value  Range   Preg Test, Ur NEGATIVE  NEGATIVE     Other Labs Obtained at Urgent Care Center:  A urine culture was obtained.  Results are pending at this time and we will call about any positive results.   Assessment:  The primary encounter diagnosis was Acute cystitis. A diagnosis of Vaginitis was also pertinent to this visit.  Plan:   1.  The following meds were prescribed:   New Prescriptions   METRONIDAZOLE (FLAGYL) 500 MG TABLET    Take 1 tablet (500 mg total) by mouth 2 (two) times daily.   NITROFURANTOIN, MACROCRYSTAL-MONOHYDRATE, (MACROBID) 100 MG CAPSULE    Take 1 capsule (100 mg total) by mouth 2 (two) times daily.   PHENAZOPYRIDINE (PYRIDIUM) 200 MG TABLET    Take 1 tablet (200 mg total) by mouth 3 (three) times daily as needed for pain.   TERCONAZOLE (TERAZOL 7) 0.4 % VAGINAL CREAM    Place 1 applicator vaginally at bedtime.   2.  The patient was instructed in symptomatic care and handouts were given. I suggested she followup with her primary care physician. Her diabetes may not be as well controlled as it could be an this may be the cause of her frequent yeast infections. 3.  The patient was told to return if becoming worse in any way, if no better in 3 or 4 days, and given some red flag symptoms that would indicate earlier return.    Reuben Likes, MD 03/14/12 2202

## 2012-03-15 NOTE — ED Notes (Addendum)
Chart review. Test reports positive for trichomonas, treated adequately w flagyl, pt needs notification, also waiting for GC/chalymidia reports

## 2012-03-16 LAB — URINE CULTURE

## 2012-03-19 ENCOUNTER — Telehealth (HOSPITAL_COMMUNITY): Payer: Self-pay | Admitting: Emergency Medicine

## 2012-03-19 MED ORDER — CIPROFLOXACIN HCL 500 MG PO TABS: 500.0000 mg | ORAL_TABLET | Freq: Two times a day (BID) | ORAL | Status: DC

## 2012-03-19 NOTE — Telephone Encounter (Signed)
Message copied by Reuben Likes on Tue Mar 19, 2012  4:47 PM ------      Message from: Vassie Moselle      Created: Tue Mar 19, 2012  2:39 PM       Urine culture: 60,000 colonies Klebsiella Pneumoniae.  Pt. treated with Macrobid- Intermediate. Is this adequate?  Pt. also had trich and was treated with Flagyl.  I don't see a a GC/Chlamydia result.      Vassie Moselle      03/19/2012

## 2012-03-19 NOTE — ED Notes (Signed)
Her urine culture grew out 60,000 colonies of Klebsiella pneumoniae. This showed only intermediate sensitivity to Macrobid, so I think we are to come up with an alternative antibiotic. Will call in prescription for Cipro 500 mg twice a day for 10 days to her pharmacy. Her DNA probe was positive for Trichomonas and negative for Candida and Gardnerella. Gonorrhea and chlamydia are not back yet.  Reuben Likes, MD 03/19/12 [phone]

## 2012-04-01 ENCOUNTER — Telehealth (HOSPITAL_COMMUNITY): Payer: Self-pay | Admitting: *Deleted

## 2012-04-01 NOTE — ED Notes (Signed)
GC/Chlamdia neg., Vassie Moselle 04/01/2012

## 2012-04-01 NOTE — ED Notes (Signed)
Spoke with pt.'s caretaker and told her pt. needs Cipro for UTI, because Urine culture was intermediate.  She said she was at the pharmacy today picking up pt.'s other meds and pharmacist said they had put it up.  I told her I would call in the Rx. tonight on their VM and she can pick it up in the AM.  She voiced understanding. Vassie Moselle 04/01/2012

## 2012-04-04 ENCOUNTER — Telehealth (HOSPITAL_COMMUNITY): Payer: Self-pay | Admitting: *Deleted

## 2012-04-24 ENCOUNTER — Emergency Department (HOSPITAL_COMMUNITY)
Admission: EM | Admit: 2012-04-24 | Discharge: 2012-04-24 | Disposition: A | Discharge status: Home / Self Care | Payer: Medicaid Other | Attending: Emergency Medicine | Admitting: Emergency Medicine

## 2012-04-24 ENCOUNTER — Encounter (HOSPITAL_COMMUNITY): Payer: Self-pay | Admitting: *Deleted

## 2012-04-24 DIAGNOSIS — E119 Type 2 diabetes mellitus without complications: Secondary | ICD-10-CM | POA: Insufficient documentation

## 2012-04-24 DIAGNOSIS — Z3202 Encounter for pregnancy test, result negative: Secondary | ICD-10-CM | POA: Insufficient documentation

## 2012-04-24 DIAGNOSIS — Z79899 Other long term (current) drug therapy: Secondary | ICD-10-CM | POA: Insufficient documentation

## 2012-04-24 DIAGNOSIS — Z8659 Personal history of other mental and behavioral disorders: Secondary | ICD-10-CM | POA: Insufficient documentation

## 2012-04-24 DIAGNOSIS — Z87891 Personal history of nicotine dependence: Secondary | ICD-10-CM | POA: Insufficient documentation

## 2012-04-24 DIAGNOSIS — Z8679 Personal history of other diseases of the circulatory system: Secondary | ICD-10-CM | POA: Insufficient documentation

## 2012-04-24 DIAGNOSIS — N390 Urinary tract infection, site not specified: Secondary | ICD-10-CM | POA: Insufficient documentation

## 2012-04-24 DIAGNOSIS — R300 Dysuria: Secondary | ICD-10-CM | POA: Insufficient documentation

## 2012-04-24 LAB — URINALYSIS, ROUTINE W REFLEX MICROSCOPIC
Hgb urine dipstick: NEGATIVE
Nitrite: NEGATIVE
Specific Gravity, Urine: 1.031 — ABNORMAL HIGH (ref 1.005–1.030)
Urobilinogen, UA: 1 mg/dL (ref 0.0–1.0)
pH: 6 (ref 5.0–8.0)

## 2012-04-24 LAB — POCT PREGNANCY, URINE: Preg Test, Ur: NEGATIVE

## 2012-04-24 LAB — URINE MICROSCOPIC-ADD ON

## 2012-04-24 LAB — GLUCOSE, CAPILLARY: Glucose-Capillary: 99 mg/dL (ref 70–99)

## 2012-04-24 MED ORDER — SULFAMETHOXAZOLE-TMP DS 800-160 MG PO TABS: 1.0000 | ORAL_TABLET | Freq: Two times a day (BID) | ORAL | Status: DC

## 2012-04-24 MED ORDER — PHENAZOPYRIDINE HCL 200 MG PO TABS: 200.0000 mg | ORAL_TABLET | Freq: Three times a day (TID) | ORAL | Status: DC | PRN

## 2012-04-24 NOTE — ED Notes (Signed)
Pt was brought here by gpd. Pt ran away from group home yesterday and had friend take her to homeless shelter. Has someone here with her from the group home. Denies being outside in the cold last night, only complaint is abd pain. No distress noted. resp e/u. Skin w/d.

## 2012-04-24 NOTE — ED Notes (Signed)
Pt brought to ED by police to be 'checked out after being out of homeless shelter all night'. Pt denies complaints other than 'some stomach pain'.

## 2012-04-24 NOTE — ED Provider Notes (Signed)
History     CSN: [id number]  Arrival date & time 04/24/12  0715   First MD Initiated Contact with Patient 04/24/12 [phone]      Chief Complaint  Patient presents with  . Abdominal Pain    (Consider location/radiation/quality/duration/timing/severity/associated sxs/prior treatment) HPI 32 -year-old female with a past medical history of MR and schizoaffective disorder and diabetes  is brought to the emergency department by [facility] police department for evaluation.  Patient is a member of a group home.  Apparently the patient ran away last night.  There was a sulfur alert out for her.  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  She had no cold exposure.  Please were concerned for evaluation for hypothermia due to last nights freezing temperatures. Patietnt states that she developed some abdominal pain after eating pinto beans. She also has some burning with urination.  She denies vaginal sxs.  Denies nausea, vomiting, diarrhea.  Past Medical History  Diagnosis Date  . Diabetes mellitus without complication   . Obesity   . Hx of emotional problems   . H/O tracheostomy     Past Surgical History  Procedure Date  . Tonsillectomy     History reviewed. No pertinent family history.  History  Substance Use Topics  . Smoking status: Former Smoker    Types: Cigarettes    Quit date: 08/07/2005  . Smokeless tobacco: Never Used  . Alcohol Use: No    OB History    Grav Para Term Preterm Abortions TAB SAB Ect Mult Living                  Review of Systems Ten systems reviewed and are negative for acute change, except as noted in the HPI.    Allergies  Penicillins  Home Medications   Current Outpatient Rx  Name  Route  Sig  Dispense  Refill  . BUTALBITAL-APAP-CAFF-COD 50-325-40-30 MG PO CAPS   Oral   Take 1 capsule by mouth every 4 (four) hours as needed.         Marland Kitchen CIPROFLOXACIN HCL 500 MG PO TABS   Oral   Take 1 tablet (500 mg total) by mouth every 12  (twelve) hours.   10 tablet   0   . CITALOPRAM HYDROBROMIDE 40 MG PO TABS   Oral   Take 40 mg by mouth at bedtime.         . CYCLOBENZAPRINE HCL 10 MG PO TABS   Oral   Take 10 mg by mouth once. Take once daily as needed at bedtime         . DOCUSATE SODIUM 100 MG PO CAPS   Oral   Take 100 mg by mouth 2 (two) times daily.         Marland Kitchen FERROUS GLUCONATE 324 (38 FE) MG PO TABS   Oral   Take 324 mg by mouth 2 (two) times daily with a meal.         . FUROSEMIDE 40 MG PO TABS   Oral   Take 40 mg by mouth daily.         Marland Kitchen LORATADINE 10 MG PO TABS   Oral   Take 10 mg by mouth daily.         Marland Kitchen MEDROXYPROGESTERONE ACETATE 150 MG/ML IM SUSP   Intramuscular   Inject 150 mg into the muscle every 3 (three) months.         . METHYLPHENIDATE HCL ER 36  MG PO TBCR   Oral   Take 36 mg by mouth every morning.         Marland Kitchen METOPROLOL SUCCINATE ER 25 MG PO TB24   Oral   Take 25 mg by mouth daily.         Marland Kitchen METRONIDAZOLE 500 MG PO TABS   Oral   Take 1 tablet (500 mg total) by mouth 2 (two) times daily.   14 tablet   0   . NAPROXEN 500 MG PO TABS   Oral   Take 500 mg by mouth 2 (two) times daily with a meal.         . NITROFURANTOIN MONOHYD MACRO 100 MG PO CAPS   Oral   Take 1 capsule (100 mg total) by mouth 2 (two) times daily.   10 capsule   0   . OMEPRAZOLE 40 MG PO CPDR   Oral   Take 40 mg by mouth 2 (two) times daily.         Marland Kitchen PALIPERIDONE ER 9 MG PO TB24   Oral   Take 9 mg by mouth every morning.         Marland Kitchen PHENAZOPYRIDINE HCL 200 MG PO TABS   Oral   Take 1 tablet (200 mg total) by mouth 3 (three) times daily as needed for pain.   15 tablet   0   . POTASSIUM CHLORIDE ER 10 MEQ PO CPCR   Oral   Take 20 mEq by mouth 2 (two) times daily.         Marland Kitchen RISPERIDONE 0.5 MG PO TABS   Oral   Take 0.5 mg by mouth daily. Take at bedtime         . TERBINAFINE HCL 250 MG PO TABS   Oral   Take 250 mg by mouth daily.         . TERCONAZOLE 0.4 % VA  CREA   Vaginal   Place 1 applicator vaginally at bedtime.   45 g   0     BP 119/86  Pulse 100  Temp 98.8 F (37.1 C) (Oral)  Resp 16  SpO2 100%  Physical Exam  Nursing note and vitals reviewed. Constitutional: She is oriented to person, place, and time. She appears well-developed and well-nourished.       Super-morbidly obese female in NAD  HENT:  Head: Normocephalic and atraumatic.  Eyes: Conjunctivae normal are normal. No scleral icterus.  Neck: Normal range of motion.  Cardiovascular: Normal rate, regular rhythm and normal heart sounds.  Exam reveals no gallop and no friction rub.   No murmur heard. Pulmonary/Chest: Effort normal and breath sounds normal. No respiratory distress.  Abdominal: Soft. Bowel sounds are normal. She exhibits no distension and no mass. There is tenderness (mild tenderness LLQ/RUQ). There is no guarding.  Neurological: She is alert and oriented to person, place, and time.  Skin: Skin is warm and dry.    ED Course  Procedures (including critical care time)  Labs Reviewed - No data to display No results found.   1. UTI (lower urinary tract infection)       MDM  8:11 AM Patient exam unconcerning.  Will examine glucose and urine for UTI/   Patient labs concerning for UTI will treat and discharge to the custod of her group home. Pt has been diagnosed with a UTI. Pt is afebrile, no CVA tenderness, normotensive, and denies N/V. Pt to be dc home with antibiotics and instructions to follow  up with PCP if symptoms persist.        Arthor Captain, PA-C 04/28/12 [phone]

## 2012-04-24 NOTE — ED Notes (Signed)
Pt unable to urinate at this time, drinking water

## 2012-04-25 LAB — URINE CULTURE: Colony Count: 40000

## 2012-04-26 ENCOUNTER — Emergency Department (HOSPITAL_COMMUNITY)
Admission: EM | Admit: 2012-04-26 | Discharge: 2012-04-26 | Disposition: A | Discharge status: Home / Self Care | Payer: Medicaid Other | Attending: Emergency Medicine | Admitting: Emergency Medicine

## 2012-04-26 ENCOUNTER — Encounter (HOSPITAL_COMMUNITY): Payer: Self-pay | Admitting: *Deleted

## 2012-04-26 DIAGNOSIS — Z79899 Other long term (current) drug therapy: Secondary | ICD-10-CM | POA: Insufficient documentation

## 2012-04-26 DIAGNOSIS — F319 Bipolar disorder, unspecified: Secondary | ICD-10-CM | POA: Insufficient documentation

## 2012-04-26 DIAGNOSIS — F209 Schizophrenia, unspecified: Secondary | ICD-10-CM | POA: Insufficient documentation

## 2012-04-26 DIAGNOSIS — Z3202 Encounter for pregnancy test, result negative: Secondary | ICD-10-CM | POA: Insufficient documentation

## 2012-04-26 DIAGNOSIS — E119 Type 2 diabetes mellitus without complications: Secondary | ICD-10-CM | POA: Insufficient documentation

## 2012-04-26 DIAGNOSIS — Z8659 Personal history of other mental and behavioral disorders: Secondary | ICD-10-CM | POA: Insufficient documentation

## 2012-04-26 DIAGNOSIS — L989 Disorder of the skin and subcutaneous tissue, unspecified: Secondary | ICD-10-CM

## 2012-04-26 DIAGNOSIS — E669 Obesity, unspecified: Secondary | ICD-10-CM | POA: Insufficient documentation

## 2012-04-26 DIAGNOSIS — Z930 Tracheostomy status: Secondary | ICD-10-CM | POA: Insufficient documentation

## 2012-04-26 DIAGNOSIS — Z87891 Personal history of nicotine dependence: Secondary | ICD-10-CM | POA: Insufficient documentation

## 2012-04-26 HISTORY — DX: Schizophrenia, unspecified: F20.9

## 2012-04-26 HISTORY — DX: Bipolar disorder, unspecified: F31.9

## 2012-04-26 HISTORY — DX: Unspecified intellectual disabilities: F79

## 2012-04-26 LAB — URINALYSIS, ROUTINE W REFLEX MICROSCOPIC
Ketones, ur: NEGATIVE mg/dL
Nitrite: POSITIVE — AB
Protein, ur: NEGATIVE mg/dL
Urobilinogen, UA: 1 mg/dL (ref 0.0–1.0)

## 2012-04-26 LAB — POCT PREGNANCY, URINE: Preg Test, Ur: NEGATIVE

## 2012-04-26 MED ORDER — FLUCONAZOLE 150 MG PO TABS
ORAL_TABLET | ORAL | Status: AC
  Administered 2012-04-26: 300 mg via ORAL
  Filled 2012-04-26: qty 2

## 2012-04-26 MED ORDER — FLUCONAZOLE 200 MG PO TABS
200.0000 mg | ORAL_TABLET | ORAL | Status: DC
  Filled 2012-04-26: qty 1

## 2012-04-26 MED ORDER — FLUCONAZOLE 150 MG PO TABS
300.0000 mg | ORAL_TABLET | Freq: Once | ORAL | Status: AC
  Administered 2012-04-26: 300 mg via ORAL

## 2012-04-26 NOTE — ED Notes (Addendum)
Pt sts she has a bump on her bottom that has been there x1 week. Also reports her care taker wanting her to have an STD eval, sts she does not know why. Sts she has not been sexually active since 2007. Has been on DepoProvera since age 32. Reports she is using a cream in her vagina for a yeast infection that was supposed to last 2 weeks but she is out after 2 days and needs more. Also reports burning with urination x1 week. Reports yellow vaginal d/c x1 wk.

## 2012-04-26 NOTE — ED Provider Notes (Signed)
I saw and evaluated the patient, reviewed the resident's note and I agree with the findings and plan.  Derwood Kaplan, MD 04/26/12 2107

## 2012-04-26 NOTE — ED Notes (Signed)
Pt's caregiver at bedside now and sts pt is in her care due to MR, Bipolar d/o and schizophrenia. Sts pt ran away recently "with a man and showed signs of having sex then she had these bumps come up on her bottom and c/o abd pain, so we wanted her checked for STDs."

## 2012-04-26 NOTE — ED Provider Notes (Signed)
History     CSN: [id number]  Arrival date & time 04/26/12  1645   First MD Initiated Contact with Patient 04/26/12 1708      Chief Complaint  Patient presents with  . "Bump on Bottom"   . "Caretaker wants STD eval done"     (Consider location/radiation/quality/duration/timing/severity/associated sxs/prior treatment) Patient is a 32 y.o. female presenting with female genitourinary complaint. The history is provided by the patient.  Female GU Problem Primary symptoms include genital lesions (On the right buttock).  Primary symptoms include no pelvic pain, no dysuria and no vaginal bleeding. There has been no fever. This is a new problem. The current episode started more than 1 week ago (2 weeks ago). The problem occurs constantly. The problem has not changed since onset.The symptoms occur spontaneously. She is not pregnant. She has not missed her period. The discharge was normal. Pertinent negatives include no abdominal swelling, no abdominal pain, no nausea and no vomiting. She has tried nothing for the symptoms. Sexual activity: sexually active and multiple partners. There is a concern regarding sexually transmitted diseases.    Past Medical History  Diagnosis Date  . Diabetes mellitus without complication   . Obesity   . Hx of emotional problems   . H/O tracheostomy   . Bipolar 1 disorder   . Schizophrenia   . Mental retardation     Past Surgical History  Procedure Date  . Tonsillectomy     No family history on file.  History  Substance Use Topics  . Smoking status: Former Smoker    Types: Cigarettes    Quit date: 08/07/2005  . Smokeless tobacco: Never Used  . Alcohol Use: No    OB History    Grav Para Term Preterm Abortions TAB SAB Ect Mult Living                  Review of Systems  Constitutional: Negative for fever and chills.  Respiratory: Negative for cough and shortness of breath.   Gastrointestinal: Negative for nausea, vomiting and abdominal pain.    Genitourinary: Negative for dysuria, urgency, vaginal bleeding, vaginal discharge, vaginal pain and pelvic pain.  All other systems reviewed and are negative.    Allergies  Penicillins  Home Medications   Current Outpatient Rx  Name  Route  Sig  Dispense  Refill  . BUTALBITAL-APAP-CAFF-COD 50-325-40-30 MG PO CAPS   Oral   Take 1 capsule by mouth every 4 (four) hours as needed. For migraine         . CITALOPRAM HYDROBROMIDE 40 MG PO TABS   Oral   Take 40 mg by mouth at bedtime.         . CYCLOBENZAPRINE HCL 10 MG PO TABS   Oral   Take 10 mg by mouth at bedtime as needed. For muscle         . DOCUSATE SODIUM 100 MG PO CAPS   Oral   Take 100 mg by mouth 2 (two) times daily.         Marland Kitchen FERROUS GLUCONATE 324 (38 FE) MG PO TABS   Oral   Take 324 mg by mouth 2 (two) times daily with a meal.         . FUROSEMIDE 40 MG PO TABS   Oral   Take 40 mg by mouth daily.         Marland Kitchen HYDROXYZINE HCL 50 MG PO TABS   Oral   Take 50 mg by mouth 3 (three)  times daily.          Marland Kitchen LORATADINE 10 MG PO TABS   Oral   Take 10 mg by mouth daily.         Marland Kitchen MEDROXYPROGESTERONE ACETATE 150 MG/ML IM SUSP   Intramuscular   Inject 150 mg into the muscle every 3 (three) months.         . METHYLPHENIDATE HCL ER 54 MG PO TBCR   Oral   Take 54 mg by mouth every morning.         Marland Kitchen METOPROLOL TARTRATE 25 MG PO TABS   Oral   Take 25 mg by mouth 2 (two) times daily.         Marland Kitchen NAPROXEN 500 MG PO TABS   Oral   Take 500 mg by mouth daily.          Marland Kitchen OMEPRAZOLE 20 MG PO CPDR   Oral   Take 20 mg by mouth 2 (two) times daily.         Marland Kitchen PALIPERIDONE ER 9 MG PO TB24   Oral   Take 9 mg by mouth every morning.         Marland Kitchen PHENAZOPYRIDINE HCL 200 MG PO TABS   Oral   Take 1 tablet (200 mg total) by mouth 3 (three) times daily as needed for pain.   6 tablet   0   . POLYETHYLENE GLYCOL 3350 PO PACK   Oral   Take 17 g by mouth daily as needed. For constipation.         Marland Kitchen  POTASSIUM CHLORIDE ER 10 MEQ PO CPCR   Oral   Take 20 mEq by mouth daily.          Marland Kitchen RISPERIDONE 0.5 MG PO TABS   Oral   Take 0.5 mg by mouth at bedtime.          . SULFAMETHOXAZOLE-TMP DS 800-160 MG PO TABS   Oral   Take 1 tablet by mouth 2 (two) times daily.   14 tablet   0     BP 103/66  Pulse 83  Temp 97.4 F (36.3 C) (Oral)  Resp 18  SpO2 100%  Physical Exam  Nursing note and vitals reviewed. Constitutional: She is oriented to person, place, and time. She appears well-developed and well-nourished. No distress.       Morbidly obese  HENT:  Head: Normocephalic and atraumatic.  Eyes: EOM are normal. Pupils are equal, round, and reactive to light.  Neck: Normal range of motion. Neck supple.  Cardiovascular: Normal rate and regular rhythm.  Exam reveals no friction rub.   No murmur heard. Pulmonary/Chest: Effort normal and breath sounds normal. No respiratory distress. She has no wheezes. She has no rales.  Abdominal: Soft. She exhibits no distension. There is no tenderness. There is no rebound.  Genitourinary:     Musculoskeletal: Normal range of motion. She exhibits no edema.  Neurological: She is alert and oriented to person, place, and time.  Skin: She is not diaphoretic.    ED Course  Procedures (including critical care time)   Labs Reviewed  POCT PREGNANCY, URINE  URINALYSIS, ROUTINE W REFLEX MICROSCOPIC   No results found.   1. Skin lesion       MDM   32 year old female history of mental retardation, ADA she presents with a bump on her bottom. Her caregiver and guardian brought her into she has a lesion on her right buttock she is concern is STD.  Patient allegedly ran off a couple days ago and has had sexual transmitted disease before. Patient is manipulative and was according to the caregiver. Patient denied any symptoms except for mild pain at the site of the lesion. Denies any vaginal symptoms. Denies any urinary symptoms. Patient has 3  small flat lesions that appear like small scabs from excoriation or an insect bite. Lesion is not consistent with herpes or warts. No concern for STD given the look of her lesion. Patient also has yeast infection currently, which she is on medication 4. Caregiver requesting STD testing, however referred to their primary care physician for this. Stable for discharge.  Patient states she's been treated for that yeast infection with a cream. Caregiver is concern about compliance. One pill Diflucan given.   Elwin Mocha, MD 04/26/12 [phone]

## 2012-04-29 NOTE — ED Provider Notes (Signed)
Medical screening examination/treatment/procedure(s) were performed by non-physician practitioner and as supervising physician I was immediately available for consultation/collaboration.  Zeric Baranowski R. Pepper Wyndham, MD 04/29/12 1455 

## 2013-07-03 ENCOUNTER — Ambulatory Visit (HOSPITAL_BASED_OUTPATIENT_CLINIC_OR_DEPARTMENT_OTHER): Payer: Medicaid Other | Attending: Internal Medicine | Admitting: Radiology

## 2013-07-03 VITALS — Ht 64.0 in | Wt 245.0 lb

## 2013-07-03 DIAGNOSIS — G4733 Obstructive sleep apnea (adult) (pediatric): Secondary | ICD-10-CM | POA: Insufficient documentation

## 2013-07-12 DIAGNOSIS — G4733 Obstructive sleep apnea (adult) (pediatric): Secondary | ICD-10-CM

## 2013-07-12 DIAGNOSIS — G4730 Sleep apnea, unspecified: Secondary | ICD-10-CM

## 2013-07-12 DIAGNOSIS — G4710 Hypersomnia, unspecified: Secondary | ICD-10-CM

## 2013-07-12 NOTE — Sleep Study (Signed)
   NAME: [name] DATE OF BIRTH:  02/21/1981 MEDICAL RECORD NUMBER [id number]  LOCATION: [facility]  PHYSICIAN: Huberta Tompkins D Hagan Maltz  DATE OF STUDY: 07/03/2013  SLEEP STUDY TYPE: Nocturnal Polysomnogram               REFERRING PHYSICIAN: Dorrene GermanAvbuere, Edwin A, MD  INDICATION FOR STUDY: Hypersomnia with sleep apnea  EPWORTH SLEEPINESS SCORE:   0/24 HEIGHT: 5\' 4"  (162.6 cm)  WEIGHT: 245 lb (111.131 kg)    Body mass index is 42.03 kg/(m^2).  NECK SIZE: 13 in.  MEDICATIONS: Charted for review  SLEEP ARCHITECTURE: Total sleep time 396.5 minutes with sleep efficiency 89.6%. Stage I was 4.8% stage II 70.2%, stage III 0.1%, REM 24.8% of total sleep time. Sleep latency 0.5 minutes, REM latency 69.5 minutes, awake after sleep onset 45.5 minutes, arousal index 9.5, bedtime medication: None  RESPIRATORY DATA: Apnea hypopnea index (AHI) 3.8 per hour. 25 total events scored including 10 obstructive apneas, 6 central apneas, 9 hypopneas. Events were seen in all positions. REM AHI 7.9 per hour. There were not enough events to permit CPAP titration.  OXYGEN DATA: Moderate snoring with oxygen desaturation to a nadir of 88% and mean oxygen saturation through the study of 97.1% on room air.  CARDIAC DATA: Normal sinus rhythm  MOVEMENT/PARASOMNIA: No significant movement disturbance, bathroom x2  IMPRESSION/ RECOMMENDATION:   1) Occasional respiratory events for sleep disturbance, within normal limits. AHI 3.8 per hour (the normal range for adults is an AHI from 0-5 events per hour). Moderate snoring with oxygen desaturation to a nadir of 88% and mean oxygen saturation through the study of 97.1% on room air. There were not enough events for CPAP.  2) A previous sleep study on 04/18/2011 cord AHI 12 per hour with body weight for that visit 365 pounds. Verify the weight change.   Signed Jetty Duhamellinton Camdyn Beske M.D. Waymon Budgelinton D Miho Monda Diplomate, Biomedical engineerAmerican Board of Sleep Medicine  ELECTRONICALLY  SIGNED ON:  07/12/2013, 10:17 AM [facility] PH: [phone]   FX: [phone] ACCREDITED BY THE AMERICAN ACADEMY OF SLEEP MEDICINE

## 2013-07-31 ENCOUNTER — Emergency Department: Payer: Self-pay | Admitting: Emergency Medicine

## 2013-07-31 LAB — URINALYSIS, COMPLETE
Bacteria: NONE SEEN
Bilirubin,UR: NEGATIVE
Glucose,UR: NEGATIVE mg/dL (ref 0–75)
Granular Cast: 5
LEUKOCYTE ESTERASE: NEGATIVE
NITRITE: NEGATIVE
Ph: 5 (ref 4.5–8.0)
Protein: 30
RBC,UR: 1 /HPF (ref 0–5)
SPECIFIC GRAVITY: 1.024 (ref 1.003–1.030)
WBC UR: 1 /HPF (ref 0–5)

## 2013-07-31 LAB — ETHANOL

## 2013-07-31 LAB — CBC
HCT: 36.9 % (ref 35.0–47.0)
HGB: 11.9 g/dL — AB (ref 12.0–16.0)
MCH: 24.3 pg — ABNORMAL LOW (ref 26.0–34.0)
MCHC: 32.1 g/dL (ref 32.0–36.0)
MCV: 76 fL — AB (ref 80–100)
PLATELETS: 250 10*3/uL (ref 150–440)
RBC: 4.87 10*6/uL (ref 3.80–5.20)
RDW: 15.9 % — AB (ref 11.5–14.5)
WBC: 11.7 10*3/uL — ABNORMAL HIGH (ref 3.6–11.0)

## 2013-07-31 LAB — COMPREHENSIVE METABOLIC PANEL
ALT: 19 U/L (ref 12–78)
ANION GAP: 9 (ref 7–16)
Albumin: 3.8 g/dL (ref 3.4–5.0)
Alkaline Phosphatase: 76 U/L
BUN: 10 mg/dL (ref 7–18)
Bilirubin,Total: 0.9 mg/dL (ref 0.2–1.0)
CALCIUM: 8.8 mg/dL (ref 8.5–10.1)
CREATININE: 0.48 mg/dL — AB (ref 0.60–1.30)
Chloride: 105 mmol/L (ref 98–107)
Co2: 23 mmol/L (ref 21–32)
EGFR (African American): >60
EGFR (Non-African Amer.): >60
Glucose: 85 mg/dL (ref 65–99)
OSMOLALITY: 272 (ref 275–301)
POTASSIUM: 3.7 mmol/L (ref 3.5–5.1)
SGOT(AST): 17 U/L (ref 15–37)
SODIUM: 137 mmol/L (ref 136–145)
Total Protein: 8.5 g/dL — ABNORMAL HIGH (ref 6.4–8.2)

## 2013-07-31 LAB — ACETAMINOPHEN LEVEL

## 2013-07-31 LAB — DRUG SCREEN, URINE
Amphetamines, Ur Screen: NEGATIVE (ref ?–1000)
BENZODIAZEPINE, UR SCRN: NEGATIVE (ref ?–200)
Barbiturates, Ur Screen: NEGATIVE (ref ?–200)
COCAINE METABOLITE, UR ~~LOC~~: NEGATIVE (ref ?–300)
Cannabinoid 50 Ng, Ur ~~LOC~~: NEGATIVE (ref ?–50)
MDMA (ECSTASY) UR SCREEN: NEGATIVE (ref ?–500)
METHADONE, UR SCREEN: NEGATIVE (ref ?–300)
Opiate, Ur Screen: NEGATIVE (ref ?–300)
PHENCYCLIDINE (PCP) UR S: NEGATIVE (ref ?–25)
TRICYCLIC, UR SCREEN: NEGATIVE (ref ?–1000)

## 2013-07-31 LAB — SALICYLATE LEVEL: Salicylates, Serum: <1.7 mg/dL

## 2013-07-31 LAB — TSH: THYROID STIMULATING HORM: 0.86 u[IU]/mL

## 2013-08-10 ENCOUNTER — Emergency Department: Payer: Self-pay | Admitting: Emergency Medicine

## 2013-08-10 LAB — URINALYSIS, COMPLETE
BLOOD: NEGATIVE
Bilirubin,UR: NEGATIVE
Glucose,UR: NEGATIVE mg/dL (ref 0–75)
Leukocyte Esterase: NEGATIVE
NITRITE: NEGATIVE
Ph: 5 (ref 4.5–8.0)
RBC,UR: 2 /HPF (ref 0–5)
SPECIFIC GRAVITY: 1.032 (ref 1.003–1.030)
Squamous Epithelial: 1
WBC UR: 1 /HPF (ref 0–5)

## 2013-08-10 LAB — COMPREHENSIVE METABOLIC PANEL
ALT: 21 U/L (ref 12–78)
ANION GAP: 5 — AB (ref 7–16)
AST: 19 U/L (ref 15–37)
Albumin: 4.1 g/dL (ref 3.4–5.0)
Alkaline Phosphatase: 81 U/L
BUN: 18 mg/dL (ref 7–18)
Bilirubin,Total: 0.9 mg/dL (ref 0.2–1.0)
CALCIUM: 9 mg/dL (ref 8.5–10.1)
CO2: 27 mmol/L (ref 21–32)
Chloride: 112 mmol/L — ABNORMAL HIGH (ref 98–107)
Creatinine: 0.64 mg/dL (ref 0.60–1.30)
Glucose: 100 mg/dL — ABNORMAL HIGH (ref 65–99)
OSMOLALITY: 289 (ref 275–301)
POTASSIUM: 3.7 mmol/L (ref 3.5–5.1)
SODIUM: 144 mmol/L (ref 136–145)
TOTAL PROTEIN: 8.8 g/dL — AB (ref 6.4–8.2)

## 2013-08-10 LAB — DRUG SCREEN, URINE
AMPHETAMINES, UR SCREEN: NEGATIVE (ref ?–1000)
Barbiturates, Ur Screen: NEGATIVE (ref ?–200)
Benzodiazepine, Ur Scrn: NEGATIVE (ref ?–200)
Cannabinoid 50 Ng, Ur ~~LOC~~: NEGATIVE (ref ?–50)
Cocaine Metabolite,Ur ~~LOC~~: NEGATIVE (ref ?–300)
MDMA (ECSTASY) UR SCREEN: NEGATIVE (ref ?–500)
METHADONE, UR SCREEN: NEGATIVE (ref ?–300)
OPIATE, UR SCREEN: NEGATIVE (ref ?–300)
PHENCYCLIDINE (PCP) UR S: NEGATIVE (ref ?–25)
Tricyclic, Ur Screen: NEGATIVE (ref ?–1000)

## 2013-08-10 LAB — CBC
HCT: 40.8 % (ref 35.0–47.0)
HGB: 12.9 g/dL (ref 12.0–16.0)
MCH: 24.3 pg — ABNORMAL LOW (ref 26.0–34.0)
MCHC: 31.7 g/dL — AB (ref 32.0–36.0)
MCV: 77 fL — AB (ref 80–100)
Platelet: 253 10*3/uL (ref 150–440)
RBC: 5.33 10*6/uL — ABNORMAL HIGH (ref 3.80–5.20)
RDW: 15.4 % — AB (ref 11.5–14.5)
WBC: 13.3 10*3/uL — AB (ref 3.6–11.0)

## 2013-08-10 LAB — ETHANOL

## 2013-08-10 LAB — SALICYLATE LEVEL

## 2013-08-10 LAB — ACETAMINOPHEN LEVEL

## 2014-07-15 ENCOUNTER — Emergency Department
Admit: 2014-07-15 | Disposition: A | Discharge status: Home / Self Care | Payer: Self-pay | Admitting: Emergency Medicine

## 2014-07-15 LAB — COMPREHENSIVE METABOLIC PANEL
ANION GAP: 5 — AB (ref 7–16)
Albumin: 3.7 g/dL
Alkaline Phosphatase: 86 U/L
BILIRUBIN TOTAL: 0.5 mg/dL
BUN: 26 mg/dL — AB
CHLORIDE: 106 mmol/L
CO2: 26 mmol/L
CREATININE: 0.65 mg/dL
Calcium, Total: 8.5 mg/dL — ABNORMAL LOW
Glucose: 106 mg/dL — ABNORMAL HIGH
POTASSIUM: 4.2 mmol/L
SGOT(AST): 22 U/L
SGPT (ALT): 15 U/L
SODIUM: 137 mmol/L
TOTAL PROTEIN: 7.6 g/dL

## 2014-07-15 LAB — DRUG SCREEN, URINE
Amphetamines, Ur Screen: NEGATIVE
BENZODIAZEPINE, UR SCRN: NEGATIVE
Barbiturates, Ur Screen: NEGATIVE
CANNABINOID 50 NG, UR ~~LOC~~: NEGATIVE
COCAINE METABOLITE, UR ~~LOC~~: NEGATIVE
MDMA (ECSTASY) UR SCREEN: NEGATIVE
Methadone, Ur Screen: NEGATIVE
OPIATE, UR SCREEN: NEGATIVE
PHENCYCLIDINE (PCP) UR S: NEGATIVE
Tricyclic, Ur Screen: NEGATIVE

## 2014-07-15 LAB — URINALYSIS, COMPLETE
Bacteria: NONE SEEN
Bilirubin,UR: NEGATIVE
Blood: NEGATIVE
Glucose,UR: NEGATIVE mg/dL (ref 0–75)
KETONE: NEGATIVE
Nitrite: NEGATIVE
PH: 5 (ref 4.5–8.0)
PROTEIN: NEGATIVE
Specific Gravity: 1.028 (ref 1.003–1.030)

## 2014-07-15 LAB — SALICYLATE LEVEL

## 2014-07-15 LAB — ETHANOL

## 2014-07-15 LAB — ACETAMINOPHEN LEVEL: Acetaminophen: <10 ug/mL

## 2014-08-02 NOTE — Consult Note (Addendum)
PATIENT NAME:  [name], [name] MR#:  [id number] DATE OF BIRTH:  [birth year]  DATE OF CONSULTATION:  07/15/2014  REFERRING PHYSICIAN:   CONSULTING PHYSICIAN:  Audery Amel, MD  IDENTIFYING INFORMATION AND CHIEF COMPLAINT: This is a 34 year old woman with a history of schizophrenia and developmental disability, who comes into the hospital after calling 911 herself.   CHIEF COMPLAINT: "I'm hearing voices telling me to go to [facility]."   HISTORY OF PRESENT ILLNESS: The patient admits that she called 911 herself because she was feeling sick and tired. Also because she has been hearing voices. She says the voices tell her that she should go to [facility]. Apparently she got frustrated and irritated at something that happened at the group home and walked away from it at one point today. She tells me that she wants to move because they do not treat her right in that they do not give her the food that she prefers. Her symptoms are that she says she hears voices every day and that has been happening since January when she was placed in this group home. She says her mood is fine. She feels a little tired during the day. Does not sleep well at night. Has no other health or physical complaints. Denies any suicidal thoughts whatsoever or homicidal thoughts. Says that she has been compliant with prescribed medication: The group home confirms the story that the patient has just been argumentative and irritable, but not dangerous. They are agreeable to her returning to the group home.   PAST PSYCHIATRIC HISTORY: Evidently she was hospitalized in [place] back in January. Was discharged to a new group home. Does not particularly like it. She sees a psychiatrist in [place] and has been going regularly and was last seen about a month ago. She says she has had mental health treatment ever since she was little. Denies any history of suicide attempts. Says she has been on a lot of different medicines and  sometimes the medicines help.   SOCIAL HISTORY: Currently residing in a group home. She does have a guardian. The patient is from [place] and dislikes being here in [place]. Apparently she does have family, but stays only in intermittent contact with them. Does not have any children.   PAST MEDICAL HISTORY: Chronic nasal congestion, otherwise does not report any specific medical problems and we are lacking other clinical information.   SUBSTANCE ABUSE HISTORY: Denies that she uses alcohol or drugs or has had any alcohol or drug problems in the past.   FAMILY HISTORY: Says that her father and her sister both have psychotic disorders.   CURRENT MEDICATIONS: The patient does not know the names of her medicines. We did not get any referral information along with her, so I do not have a list.   ALLERGIES:  PENICILLIN.   REVIEW OF SYSTEMS: Says that she hears voices telling her to go to [facility] [facility]. Denies suicidal ideation. Denies homicidal ideation. Other than being tired and having poor sleep does not have any specific physical complaints.   MENTAL STATUS EXAMINATION: Neatly groomed woman, looks her stated age, cooperative with the interview. Eye contact good. Psychomotor activity calm. Speech normal in tone, decreased in rate and amount. Affect blunted. Mood stated as being okay. Thoughts are lucid without obvious delusions, but they are a little bit slow and concrete. Denies visual hallucinations, but endorses auditory hallucinations, but does not seem to be responding to internal stimuli. Denies suicidal or homicidal ideation. Can repeat 2 out of 3  words initially and takes several tries to get all 3, but then remembers all 3 at 3 minutes. She is alert and oriented x 4. Chronically impaired judgment and insight.   LABORATORY RESULTS: Urinalysis shows 2 + leukocyte esterase, positive white cells, no bacteria, negative nitrates. Drug screen is all negative. Alcohol level and acetaminophen  negative. Chemistry panel unremarkable.   VITAL SIGNS: Blood pressure 114/53, respirations 18, pulse 101, temperature 99.1.   ASSESSMENT: This is a 34 year old woman with a history of chronic mental illness, probably schizophrenia and developmental disability, who has had some complaints about her group home, but nothing clearly dangerous. There is no indication for hospitalization. Although she claims to have hallucinations she is not behaving as though she is responding to internal stimuli. She is agreeable to going back to her group home. The group home is willing to have her back. No indication to change any medicine.   TREATMENT PLAN:  Case discussed with Emergency Room physician. She will be discharged back to her group home. Follow up in the community with her usual psychiatrist. Work with her provider and her guardian about whether she should be placed in a different facility.   DIAGNOSIS PRINCIPAL AND PRIMARY:   AXIS I: Schizophrenia.   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   AXIS II: Deferred.   AXIS III:  1.  Overweight.  2.  Chronic nasal allergies.      ____________________________ Audery AmelJohn T. Siah Kannan, MD jtc:bu D: 07/15/2014 18:46:46 ET T: 07/15/2014 19:07:28 ET JOB#: [id number]  cc: Audery AmelJohn T. Lawrencia Mauney, MD, <Dictator> Audery AmelJOHN T Wilferd Ritson MD ELECTRONICALLY SIGNED 08/07/2014 10:11

## 2014-08-28 ENCOUNTER — Emergency Department: Payer: Medicaid Other

## 2014-08-28 ENCOUNTER — Emergency Department
Admission: EM | Admit: 2014-08-28 | Discharge: 2014-08-29 | Disposition: A | Discharge status: Home / Self Care | Payer: Medicaid Other | Attending: Emergency Medicine | Admitting: Emergency Medicine

## 2014-08-28 ENCOUNTER — Encounter: Payer: Self-pay | Admitting: Emergency Medicine

## 2014-08-28 DIAGNOSIS — R45851 Suicidal ideations: Secondary | ICD-10-CM | POA: Diagnosis present

## 2014-08-28 DIAGNOSIS — E119 Type 2 diabetes mellitus without complications: Secondary | ICD-10-CM | POA: Insufficient documentation

## 2014-08-28 DIAGNOSIS — F32A Depression, unspecified: Secondary | ICD-10-CM

## 2014-08-28 DIAGNOSIS — Z880 Allergy status to penicillin: Secondary | ICD-10-CM | POA: Insufficient documentation

## 2014-08-28 DIAGNOSIS — Z792 Long term (current) use of antibiotics: Secondary | ICD-10-CM | POA: Diagnosis not present

## 2014-08-28 DIAGNOSIS — Z3202 Encounter for pregnancy test, result negative: Secondary | ICD-10-CM | POA: Diagnosis not present

## 2014-08-28 DIAGNOSIS — Z79899 Other long term (current) drug therapy: Secondary | ICD-10-CM | POA: Insufficient documentation

## 2014-08-28 DIAGNOSIS — F329 Major depressive disorder, single episode, unspecified: Secondary | ICD-10-CM | POA: Insufficient documentation

## 2014-08-28 DIAGNOSIS — Z87891 Personal history of nicotine dependence: Secondary | ICD-10-CM | POA: Diagnosis not present

## 2014-08-28 DIAGNOSIS — Z793 Long term (current) use of hormonal contraceptives: Secondary | ICD-10-CM | POA: Diagnosis not present

## 2014-08-28 DIAGNOSIS — Z791 Long term (current) use of non-steroidal anti-inflammatories (NSAID): Secondary | ICD-10-CM | POA: Diagnosis not present

## 2014-08-28 LAB — COMPREHENSIVE METABOLIC PANEL
ALBUMIN: 3.8 g/dL (ref 3.5–5.0)
ALK PHOS: 75 U/L (ref 38–126)
ALT: 16 U/L (ref 14–54)
ANION GAP: 9 (ref 5–15)
AST: 21 U/L (ref 15–41)
BUN: 18 mg/dL (ref 6–20)
CO2: 23 mmol/L (ref 22–32)
CREATININE: 0.63 mg/dL (ref 0.44–1.00)
Calcium: 8.9 mg/dL (ref 8.9–10.3)
Chloride: 108 mmol/L (ref 101–111)
GFR calc non Af Amer: >60 mL/min (ref >60)
Glucose, Bld: 85 mg/dL (ref 65–99)
Potassium: 4.1 mmol/L (ref 3.5–5.1)
SODIUM: 140 mmol/L (ref 135–145)
TOTAL PROTEIN: 8.4 g/dL — AB (ref 6.5–8.1)
Total Bilirubin: 0.5 mg/dL (ref 0.3–1.2)

## 2014-08-28 LAB — URINE DRUG SCREEN, QUALITATIVE (ARMC ONLY)
Amphetamines, Ur Screen: NOT DETECTED
BENZODIAZEPINE, UR SCRN: NOT DETECTED
Barbiturates, Ur Screen: NOT DETECTED
Cannabinoid 50 Ng, Ur ~~LOC~~: NOT DETECTED
Cocaine Metabolite,Ur ~~LOC~~: NOT DETECTED
MDMA (ECSTASY) UR SCREEN: NOT DETECTED
Methadone Scn, Ur: NOT DETECTED
Opiate, Ur Screen: NOT DETECTED
PHENCYCLIDINE (PCP) UR S: NOT DETECTED
Tricyclic, Ur Screen: NOT DETECTED

## 2014-08-28 LAB — URINALYSIS COMPLETE WITH MICROSCOPIC (ARMC ONLY)
BACTERIA UA: NONE SEEN
BILIRUBIN URINE: NEGATIVE
Glucose, UA: NEGATIVE mg/dL
HGB URINE DIPSTICK: NEGATIVE
Ketones, ur: NEGATIVE mg/dL
Nitrite: NEGATIVE
Protein, ur: NEGATIVE mg/dL
Specific Gravity, Urine: 1.018 (ref 1.005–1.030)
pH: 5 (ref 5.0–8.0)

## 2014-08-28 LAB — ACETAMINOPHEN LEVEL: Acetaminophen (Tylenol), Serum: <10 ug/mL — ABNORMAL LOW (ref 10–30)

## 2014-08-28 LAB — ETHANOL

## 2014-08-28 LAB — CBC
HCT: 39.4 % (ref 35.0–47.0)
Hemoglobin: 13 g/dL (ref 12.0–16.0)
MCH: 24.9 pg — ABNORMAL LOW (ref 26.0–34.0)
MCHC: 33 g/dL (ref 32.0–36.0)
MCV: 75.5 fL — ABNORMAL LOW (ref 80.0–100.0)
PLATELETS: 217 10*3/uL (ref 150–440)
RBC: 5.22 MIL/uL — AB (ref 3.80–5.20)
RDW: 14.5 % (ref 11.5–14.5)
WBC: 9.7 10*3/uL (ref 3.6–11.0)

## 2014-08-28 LAB — SALICYLATE LEVEL: Salicylate Lvl: <4 mg/dL (ref 2.8–30.0)

## 2014-08-28 LAB — POCT PREGNANCY, URINE: Preg Test, Ur: NEGATIVE

## 2014-08-28 MED ORDER — RISPERIDONE 1 MG PO TABS
ORAL_TABLET | ORAL | Status: AC
  Administered 2014-08-28: 0.5 mg via ORAL
  Filled 2014-08-28: qty 1

## 2014-08-28 MED ORDER — CITALOPRAM HYDROBROMIDE 20 MG PO TABS
40.0000 mg | ORAL_TABLET | Freq: Every day | ORAL | Status: DC
  Administered 2014-08-28: 40 mg via ORAL
  Filled 2014-08-28: qty 2

## 2014-08-28 MED ORDER — RISPERIDONE 1 MG PO TABS
0.5000 mg | ORAL_TABLET | Freq: Every day | ORAL | Status: DC
  Administered 2014-08-28: 0.5 mg via ORAL

## 2014-08-28 NOTE — ED Notes (Signed)
Received sandwich tray and drink 

## 2014-08-28 NOTE — ED Notes (Signed)
Spoke with pharmacy (2nd attempt) at this time for an update on medication needed to ER unit. Awaiting medication from pharmacy.

## 2014-08-28 NOTE — ED Notes (Signed)
Spoke with pharmacy(1st attempt) on medication needed for ER unit. Awaiting medication from pharmacy.

## 2014-08-28 NOTE — ED Notes (Signed)
Psych consult pending.

## 2014-08-28 NOTE — ED Notes (Signed)
ED BHU PLACEMENT JUSTIFICATION Is the patient under IVC or is there intent for IVC: Yes.   Is the patient medically cleared: Yes.   Is there vacancy in the ED BHU: Yes.   Is the population mix appropriate for patient: Yes.   Is the patient awaiting placement in inpatient or outpatient setting: No. Has the patient had a psychiatric consult: No. Survey of unit performed for contraband, proper placement and condition of furniture, tampering with fixtures in bathroom, shower, and each patient room: Yes.   APPEARANCE/BEHAVIOR calm and cooperative NEURO ASSESSMENT Orientation: time, place and person Hallucinations: No.None noted (Hallucinations) Speech: Normal Gait: normal RESPIRATORY ASSESSMENT Normal expansion.  Clear to auscultation.  No rales, rhonchi, or wheezing. CARDIOVASCULAR ASSESSMENT regular rate and rhythm, S1, S2 normal, no murmur, click, rub or gallop GASTROINTESTINAL ASSESSMENT soft, nontender, BS WNL, no r/g EXTREMITIES normal strength, tone, and muscle mass PLAN OF CARE Provide calm/safe environment. Vital signs assessed twice daily. ED BHU Assessment once each 12-hour shift. Collaborate with intake RN daily or as condition indicates. Assure the ED provider has rounded once each shift. Provide and encourage hygiene. Provide redirection as needed. Assess for escalating behavior; address immediately and inform ED provider.  Assess family dynamic and appropriateness for visitation as needed: Yes.   Educate the patient/family about BHU procedures/visitation: Yes.    

## 2014-08-28 NOTE — ED Notes (Signed)
Transfer to BHU  

## 2014-08-28 NOTE — ED Notes (Signed)
Remove 1:1 sitter , 15 min safety check

## 2014-08-28 NOTE — ED Notes (Signed)
BEHAVIORAL HEALTH ROUNDING Patient sleeping: Yes.   Patient alert and oriented: yes Behavior appropriate: Yes.  ;  Nutrition and fluids offered: Yes  Toileting and hygiene offered: Yes  Sitter present: yes Law enforcement present: Yes  

## 2014-08-28 NOTE — ED Notes (Signed)

## 2014-08-28 NOTE — ED Notes (Signed)
Pt transferred into ED BHU room 4 after receiving report from Lauren   Patient assigned to appropriate care area. Patient oriented to unit/care area: Informed that, for their safety, care areas are designed for safety and monitored by security cameras at all times; Visiting hours and phone times explained to patient. Patient verbalizes understanding, and verbal contract for safety obtained.

## 2014-08-28 NOTE — ED Notes (Addendum)
Pt transported to xray 

## 2014-08-28 NOTE — ED Notes (Signed)
Snack provided by ED Tech. Pt observed with no unusual behavior. Appropriate to stimulation. No verbalized needs or concerns at this time. NAD assessed. Will continue to monitor.  
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
## 2014-08-28 NOTE — ED Provider Notes (Signed)
[facility] Emergency Department Provider Note  ____________________________________________  Time seen: Approximately 5:43 PM  I have reviewed the triage vital signs and the nursing notes.   HISTORY  Chief Complaint Suicidal    HPI [name] is a 34 y.o. female from a group home who reports she is suicidal she feels depressed because she doesn't have a family and again says she suicidal but cannot tell me a plan she cannot say when this started cannot say how severe it is although she is tearful while she's watching TV.   Past Medical History  Diagnosis Date  . Diabetes mellitus without complication   . Obesity   . Hx of emotional problems   . H/O tracheostomy   . Bipolar 1 disorder   . Schizophrenia   . Mental retardation     Patient Active Problem List   Diagnosis Date Noted  . URI 06/02/2008  . COSTOCHONDRITIS 06/02/2008  . GASTROENTERITIS 04/17/2008  . TINEA PEDIS 03/24/2008  . LUMBAGO 03/24/2008  . TINEA CORPORIS 01/07/2008  . OBESITY, MORBID 09/13/2006  . BIPOLAR I, MIXED, MOST RECENT EPSD NOS 09/13/2006  . ATTENTION DEFICIT HYPERACTIVITY DISORDER 09/13/2006  . MENTAL RETARDATION, MODERATE 09/13/2006  . GERD 09/13/2006  . CELLULITIS, LEG, LEFT 09/13/2006  . UTI 10/02/2005  . ANEMIA-IRON DEFICIENCY 09/29/2005  . Other acquired absence of organ 09/20/2005  . SLEEP APNEA 01/16/2005    Past Surgical History  Procedure Laterality Date  . Tonsillectomy      Current Outpatient Rx  Name  Route  Sig  Dispense  Refill  . butalbital-acetaminophen-caffeine (FIORICET WITH CODEINE) 50-325-40-30 MG per capsule   Oral   Take 1 capsule by mouth every 4 (four) hours as needed. For migraine         . citalopram (CELEXA) 40 MG tablet   Oral   Take 40 mg by mouth at bedtime.         . cyclobenzaprine (FLEXERIL) 10 MG tablet   Oral   Take 10 mg by mouth at bedtime as needed. For muscle         . docusate sodium (COLACE) 100 MG  capsule   Oral   Take 100 mg by mouth 2 (two) times daily.         . ferrous gluconate (FERGON) 324 MG tablet   Oral   Take 324 mg by mouth 2 (two) times daily with a meal.         . furosemide (LASIX) 40 MG tablet   Oral   Take 40 mg by mouth daily.         . hydrOXYzine (ATARAX/VISTARIL) 50 MG tablet   Oral   Take 50 mg by mouth 3 (three) times daily.          Marland Kitchen loratadine (CLARITIN) 10 MG tablet   Oral   Take 10 mg by mouth daily.         . medroxyPROGESTERone (DEPO-PROVERA) 150 MG/ML injection   Intramuscular   Inject 150 mg into the muscle every 3 (three) months.         . methylphenidate (CONCERTA) 54 MG CR tablet   Oral   Take 54 mg by mouth every morning.         . metoprolol tartrate (LOPRESSOR) 25 MG tablet   Oral   Take 25 mg by mouth 2 (two) times daily.         . naproxen (NAPROSYN) 500 MG tablet   Oral  Take 500 mg by mouth daily.          Marland Kitchen omeprazole (PRILOSEC) 20 MG capsule   Oral   Take 20 mg by mouth 2 (two) times daily.         . paliperidone (INVEGA) 9 MG 24 hr tablet   Oral   Take 9 mg by mouth every morning.         . phenazopyridine (PYRIDIUM) 200 MG tablet   Oral   Take 1 tablet (200 mg total) by mouth 3 (three) times daily as needed for pain.   6 tablet   0   . polyethylene glycol (MIRALAX / GLYCOLAX) packet   Oral   Take 17 g by mouth daily as needed. For constipation.         . potassium chloride (MICRO-K) 10 MEQ CR capsule   Oral   Take 20 mEq by mouth daily.          . risperiDONE (RISPERDAL) 0.5 MG tablet   Oral   Take 0.5 mg by mouth at bedtime.          . sulfamethoxazole-trimethoprim (BACTRIM DS) 800-160 MG per tablet   Oral   Take 1 tablet by mouth 2 (two) times daily.   14 tablet   0     Allergies Penicillins  No family history on file.  Social History History  Substance Use Topics  . Smoking status: Former Smoker    Types: Cigarettes    Quit date: 08/07/2005  . Smokeless  tobacco: Never Used  . Alcohol Use: No    Review of Systems }Constitutional: No fever/chills Eyes: No visual changes. ENT: No sore throat. Cardiovascular: Denies chest pain. Respiratory: Denies shortness of breath. Gastrointestinal: No abdominal pain.  No nausea, no vomiting.  No diarrhea.  No constipation. Genitourinary: Negative for dysuria. Musculoskeletal: Negative for back pain. Skin: Negative for rash. Neurological: Negative for headaches, focal weakness or numbness.  10-point ROS otherwise negative.  ____________________________________________   PHYSICAL EXAM:  VITAL SIGNS: ED Triage Vitals  Enc Vitals Group     BP 08/28/14 1615 128/73 mmHg     Pulse Rate 08/28/14 1615 81     Resp 08/28/14 1615 18     Temp 08/28/14 1615 98.4 F (36.9 C)     Temp Source 08/28/14 1615 Oral     SpO2 08/28/14 1615 98 %     Weight 08/28/14 1615 296 lb (134.265 kg)     Height 08/28/14 1615  (1.626 m)     Head Cir --      Peak Flow --      Pain Score 08/28/14 1616 6     Pain Loc --      Pain Edu? --      Excl. in GC? --     Constitutional: Alert and oriented. Well appearing and in no acute distress but sad appearing Eyes: Conjunctivae are normal. PERRL. EOMI. Head: Atraumatic. Nose: No congestion/rhinnorhea. Mouth/Throat: Mucous membranes are moist.  Oropharynx non-erythematous. Neck: No stridor.  Cardiovascular: Normal rate, regular rhythm. Grossly normal heart sounds.  Good peripheral circulation. Respiratory: Normal respiratory effort.  No retractions. Lungs CTAB. Gastrointestinal: Soft and nontender. No distention. No abdominal bruits. No CVA tenderness. Musculoskeletal: No lower extremity tenderness nor edema.  No joint effusions. Neurologic:  Normal speech and language. No gross focal neurologic deficits are appreciated. Speech is normal. No gait instability. Skin:  Skin is warm, dry and intact. No rash noted.   ____________________________________________  LABS (all labs ordered are listed, but only abnormal results are displayed)  Labs Reviewed  CBC - Abnormal; Notable for the following:    RBC 5.22 (*)    MCV 75.5 (*)    MCH 24.9 (*)    All other components within normal limits  COMPREHENSIVE METABOLIC PANEL - Abnormal; Notable for the following:    Total Protein 8.4 (*)    All other components within normal limits  URINALYSIS COMPLETEWITH MICROSCOPIC ([facility] ONLY) - Abnormal; Notable for the following:    Color, Urine YELLOW (*)    APPearance CLEAR (*)    Leukocytes, UA TRACE (*)    Squamous Epithelial / LPF 0-5 (*)    All other components within normal limits  ACETAMINOPHEN LEVEL  ETHANOL  SALICYLATE LEVEL  URINE DRUG SCREEN, QUALITATIVE ([facility] ONLY)  POC URINE PREG, ED  POCT PREGNANCY, URINE   ____________________________________________  EKG   ____________________________________________  RADIOLOGY   ____________________________________________   PROCEDURES  Procedure(s) performed: None  Critical Care performed: No  ____________________________________________   INITIAL IMPRESSION / ASSESSMENT AND PLAN / ED COURSE  Pertinent labs & imaging results that were available during my care of the patient were reviewed by me and considered in my medical decision making (see chart for details).   ____________________________________________   FINAL CLINICAL IMPRESSION(S) / ED DIAGNOSES  Final diagnoses:  Depressed     Arnaldo NatalPaul F Malinda, MD 08/28/14 1746
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
## 2014-08-29 MED ORDER — FUROSEMIDE 40 MG PO TABS
40.0000 mg | ORAL_TABLET | Freq: Once | ORAL | Status: AC
  Administered 2014-08-29: 40 mg via ORAL
  Filled 2014-08-29: qty 1

## 2014-08-29 MED ORDER — HYDROXYZINE HCL 25 MG PO TABS
50.0000 mg | ORAL_TABLET | Freq: Three times a day (TID) | ORAL | Status: DC
  Administered 2014-08-29: 50 mg via ORAL
  Filled 2014-08-29: qty 2

## 2014-08-29 MED ORDER — METOPROLOL TARTRATE 25 MG PO TABS
25.0000 mg | ORAL_TABLET | Freq: Two times a day (BID) | ORAL | Status: DC
  Administered 2014-08-29: 25 mg via ORAL

## 2014-08-29 MED ORDER — POTASSIUM CHLORIDE CRYS ER 20 MEQ PO TBCR
10.0000 meq | EXTENDED_RELEASE_TABLET | Freq: Once | ORAL | Status: AC
  Administered 2014-08-29: 10 meq via ORAL
  Filled 2014-08-29: qty 1

## 2014-08-29 MED ORDER — HYDRALAZINE HCL 25 MG PO TABS
ORAL_TABLET | ORAL | Status: AC
  Administered 2014-08-29: 50 mg
  Filled 2014-08-29: qty 2

## 2014-08-29 MED ORDER — METOPROLOL TARTRATE 25 MG PO TABS
ORAL_TABLET | ORAL | Status: AC
  Filled 2014-08-29: qty 2

## 2014-08-29 MED ORDER — PALIPERIDONE ER 3 MG PO TB24
9.0000 mg | ORAL_TABLET | Freq: Every day | ORAL | Status: DC
  Administered 2014-08-29: 9 mg via ORAL
  Filled 2014-08-29: qty 3

## 2014-08-29 NOTE — ED Notes (Signed)
BEHAVIORAL HEALTH ROUNDING Patient sleeping: Yes.   Patient alert and oriented: yes Behavior appropriate: Yes.  ; If no, describe:  Nutrition and fluids offered: Yes  Toileting and hygiene offered: Yes  Sitter present: no Law enforcement present: Yes  

## 2014-08-29 NOTE — ED Notes (Signed)
BEHAVIORAL HEALTH ROUNDING  Patient sleeping: Yes  Patient alert and oriented: Yes  Behavior appropriate: Yes. ; If no, describe:  Nutrition and fluids offered: Yes  Toileting and hygiene offered: Yes  Sitter present: q15 minute observations and security camera monitoring  Law enforcement present: Yes ODS   

## 2014-08-29 NOTE — ED Notes (Signed)
BEHAVIORAL HEALTH ROUNDING Patient sleeping: No. Patient alert and oriented: yes Behavior appropriate: Yes.  ; If no, describe:  Nutrition and fluids offered: Yes  Toileting and hygiene offered: Yes  Sitter present: no Law enforcement present: Yes  

## 2014-08-29 NOTE — ED Notes (Signed)
Regular meds ordered per dr Inocencio Homesgayle.

## 2014-08-29 NOTE — ED Notes (Signed)

## 2014-08-29 NOTE — Consult Note (Signed)
[place] Psychiatry Consult   Reason for Consult:  Follow up Referring Physician:  ER Patient Identification: [name] MRN:  [id number] Principal Diagnosis: <principal problem not specified> Diagnosis:   Patient Active Problem List   Diagnosis Date Noted  . URI [J06.9] 06/02/2008  . COSTOCHONDRITIS [M94.0] 06/02/2008  . GASTROENTERITIS [K52.89] 04/17/2008  . TINEA PEDIS [B35.3] 03/24/2008  . LUMBAGO [M54.5] 03/24/2008  . TINEA CORPORIS [B35.4] 01/07/2008  . OBESITY, MORBID [E66.01] 09/13/2006  . BIPOLAR I, MIXED, MOST RECENT EPSD NOS [F31.60] 09/13/2006  . ATTENTION DEFICIT HYPERACTIVITY DISORDER [F90.9] 09/13/2006  . MENTAL RETARDATION, MODERATE [F71] 09/13/2006  . GERD [K21.9] 09/13/2006  . CELLULITIS, LEG, LEFT [L02.419, E08.144] 09/13/2006  . UTI [N39.0] 10/02/2005  . ANEMIA-IRON DEFICIENCY [D50.9] 09/29/2005  . 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 SLEEP APNEA [G47.30] 01/16/2005    Total Time spent with patient: 45 minutes  Subjective:   Will D Schuitema is a 34 y.o. female patient admitted with a long H/O mental illness and was upset with staff at [facility] home and called EMS for help.   HPI:  Pt wanted to make a telephone call and did not let her do the same. HPI Elements:     Past Medical History:  Past Medical History  Diagnosis Date  . Diabetes mellitus without complication   . Obesity   . Hx of emotional problems   . H/O tracheostomy   . Bipolar 1 disorder   . Schizophrenia   . Mental retardation     Past Surgical History  Procedure Laterality Date  . Tonsillectomy     Family History: No family history on file. Social History:  History  Alcohol Use No     History  Drug Use No    History   Social History  . Marital Status: Single    Spouse Name: N/A  . Number of Children: N/A  . Years of Education: N/A   Social History Main Topics  . Smoking status: Former Smoker    Types: Cigarettes    Quit date: 08/07/2005   . Smokeless tobacco: Never Used  . Alcohol Use: No  . Drug Use: No  . Sexual Activity: Yes    Birth Control/ Protection: None   Other Topics Concern  . None   Social History Narrative   Additional Social History:                          Allergies:   Allergies  Allergen Reactions  . Penicillins Hives    Labs:  Results for orders placed or performed during the hospital encounter of 08/28/14 (from the past 48 hour(s))  Acetaminophen level     Status: Abnormal   Collection Time: 08/28/14  4:41 PM  Result Value Ref Range   Acetaminophen (Tylenol), Serum <10 (L) 10 - 30 ug/mL    Comment:        THERAPEUTIC CONCENTRATIONS VARY SIGNIFICANTLY. A RANGE OF 10-30 ug/mL MAY BE AN EFFECTIVE CONCENTRATION FOR MANY PATIENTS. HOWEVER, SOME ARE BEST TREATED AT CONCENTRATIONS OUTSIDE THIS RANGE. ACETAMINOPHEN CONCENTRATIONS >150 ug/mL AT 4 HOURS AFTER INGESTION AND >50 ug/mL AT 12 HOURS AFTER INGESTION ARE OFTEN ASSOCIATED WITH TOXIC REACTIONS.   CBC     Status: Abnormal   Collection Time: 08/28/14  4:41 PM  Result Value Ref Range   WBC 9.7 3.6 - 11.0 K/uL   RBC 5.22 (H) 3.80 - 5.20 MIL/uL   Hemoglobin 13.0 12.0 -  16.0 g/dL   HCT 39.4 35.0 - 47.0 %   MCV 75.5 (L) 80.0 - 100.0 fL   MCH 24.9 (L) 26.0 - 34.0 pg   MCHC 33.0 32.0 - 36.0 g/dL   RDW 14.5 11.5 - 14.5 %   Platelets 217 150 - 440 K/uL  Comprehensive metabolic panel     Status: Abnormal   Collection Time: 08/28/14  4:41 PM  Result Value Ref Range   Sodium 140 135 - 145 mmol/L   Potassium 4.1 3.5 - 5.1 mmol/L   Chloride 108 101 - 111 mmol/L   CO2 23 22 - 32 mmol/L   Glucose, Bld 85 65 - 99 mg/dL   BUN 18 6 - 20 mg/dL   Creatinine, Ser 0.63 0.44 - 1.00 mg/dL   Calcium 8.9 8.9 - 10.3 mg/dL   Total Protein 8.4 (H) 6.5 - 8.1 g/dL   Albumin 3.8 3.5 - 5.0 g/dL   AST 21 15 - 41 U/L   ALT 16 14 - 54 U/L   Alkaline Phosphatase 75 38 - 126 U/L   Total Bilirubin 0.5 0.3 - 1.2 mg/dL   GFR calc non Af Amer >60  >60 mL/min   GFR calc Af Amer >60 >60 mL/min    Comment: (NOTE) The eGFR has been calculated using the CKD EPI equation. This calculation has not been validated in all clinical situations. eGFR's persistently <60 mL/min signify possible Chronic Kidney Disease.    Anion gap 9 5 - 15  Ethanol (ETOH)     Status: None   Collection Time: 08/28/14  4:41 PM  Result Value Ref Range   Alcohol, Ethyl (B) <5 <5 mg/dL    Comment:        LOWEST DETECTABLE LIMIT FOR SERUM ALCOHOL IS 11 mg/dL FOR MEDICAL PURPOSES ONLY   Salicylate level     Status: None   Collection Time: 08/28/14  4:41 PM  Result Value Ref Range   Salicylate Lvl <1.0 2.8 - 30.0 mg/dL  Urine Drug Screen, Qualitative [facility])     Status: None   Collection Time: 08/28/14  4:41 PM  Result Value Ref Range   Tricyclic, Ur Screen NONE DETECTED NONE DETECTED   Amphetamines, Ur Screen NONE DETECTED NONE DETECTED   MDMA (Ecstasy)Ur Screen NONE DETECTED NONE DETECTED   Cocaine Metabolite,Ur [place] NONE DETECTED NONE DETECTED   Opiate, Ur Screen NONE DETECTED NONE DETECTED   Phencyclidine (PCP) Ur S NONE DETECTED NONE DETECTED   Cannabinoid 50 Ng, Ur [place] NONE DETECTED NONE DETECTED   Barbiturates, Ur Screen NONE DETECTED NONE DETECTED   Benzodiazepine, Ur Scrn NONE DETECTED NONE DETECTED   Methadone Scn, Ur NONE DETECTED NONE DETECTED    Comment: (NOTE) 258  Tricyclics, urine               Cutoff 1000 ng/mL 200  Amphetamines, urine             Cutoff 1000 ng/mL 300  MDMA (Ecstasy), urine           Cutoff 500 ng/mL 400  Cocaine Metabolite, urine       Cutoff 300 ng/mL 500  Opiate, urine                   Cutoff 300 ng/mL 600  Phencyclidine (PCP), urine      Cutoff 25 ng/mL 700  Cannabinoid, urine              Cutoff 50 ng/mL 800  Barbiturates, urine  Cutoff 200 ng/mL 900  Benzodiazepine, urine           Cutoff 200 ng/mL 1000 Methadone, urine                Cutoff 300 ng/mL 1100 1200 The urine drug screen provides only a  preliminary, unconfirmed 1300 analytical test result and should not be used for non-medical 1400 purposes. Clinical consideration and professional judgment should 1500 be applied to any positive drug screen result due to possible 1600 interfering substances. A more specific alternate chemical method 1700 must be used in order to obtain a confirmed analytical result.  1800 Gas chromato graphy / mass spectrometry (GC/MS) is the preferred 1900 confirmatory method.   Urinalysis complete, with microscopic [facility])     Status: Abnormal   Collection Time: 08/28/14  4:41 PM  Result Value Ref Range   Color, Urine YELLOW (A) YELLOW   APPearance CLEAR (A) CLEAR   Glucose, UA NEGATIVE NEGATIVE mg/dL   Bilirubin Urine NEGATIVE NEGATIVE   Ketones, ur NEGATIVE NEGATIVE mg/dL   Specific Gravity, Urine 1.018 1.005 - 1.030   Hgb urine dipstick NEGATIVE NEGATIVE   pH 5.0 5.0 - 8.0   Protein, ur NEGATIVE NEGATIVE mg/dL   Nitrite NEGATIVE NEGATIVE   Leukocytes, UA TRACE (A) NEGATIVE   RBC / HPF 0-5 0 - 5 RBC/hpf   WBC, UA 0-5 0 - 5 WBC/hpf   Bacteria, UA NONE SEEN NONE SEEN   Squamous Epithelial / LPF 0-5 (A) NONE SEEN  Pregnancy, urine POC     Status: None   Collection Time: 08/28/14  4:49 PM  Result Value Ref Range   Preg Test, Ur NEGATIVE NEGATIVE    Comment:        THE SENSITIVITY OF THIS METHODOLOGY IS >24 mIU/mL     Vitals: Blood pressure 106/70, pulse 70, temperature 98.4 F (36.9 C), temperature source Oral, resp. rate 18, height 5' 4"  (1.626 m), weight 134.265 kg (296 lb), SpO2 100 %.  Risk to Self: Suicidal Ideation: Yes-Currently Present Suicidal Intent: Yes-Currently Present Is patient at risk for suicide?: Yes Suicidal Plan?: No Access to Means: No ("they took the knives out.") What has been your use of drugs/alcohol within the last 12 months?: no How many times?: 0 Other Self Harm Risks: thoughts of Triggers for Past Attempts: None known Intentional Self Injurious Behavior:  None Risk to Others: Homicidal Ideation: No Thoughts of Harm to Others: No Current Homicidal Intent: No Current Homicidal Plan: No Access to Homicidal Means: No Identified Victim: none History of harm to others?: No Assessment of Violence: On admission Violent Behavior Description: denies Does patient have access to weapons?: No ("they took the knives away.") Criminal Charges Pending?: No Does patient have a court date: No Prior Inpatient Therapy: Prior Inpatient Therapy: No Prior Therapy Dates: none Prior Therapy Facilty/Provider(s): none Reason for Treatment: unknown Prior Outpatient Therapy: Prior Outpatient Therapy: No Prior Therapy Dates: none Prior Therapy Facilty/Provider(s): none Reason for Treatment: none Does patient have an ACCT team?: No Does patient have Intensive In-House Services?  : No Does patient have [facility] services? : No Does patient have P4CC services?: Unknown  Current Facility-Administered Medications  Medication Dose Route Frequency Provider Last Rate Last Dose  . citalopram (CELEXA) tablet 40 mg  40 mg Oral QHS Nena Polio, MD   40 mg at 08/28/14 2326  . hydrOXYzine (ATARAX/VISTARIL) tablet 50 mg  50 mg Oral TID Joanne Gavel, MD   50 mg at 08/29/14 1612  .  metoprolol tartrate (LOPRESSOR) tablet 25 mg  25 mg Oral BID Joanne Gavel, MD   25 mg at 08/29/14 1101  . paliperidone (INVEGA) 24 hr tablet 9 mg  9 mg Oral Daily Joanne Gavel, MD   9 mg at 08/29/14 1123  . risperiDONE (RISPERDAL) tablet 0.5 mg  0.5 mg Oral QHS Nena Polio, MD   0.5 mg at 08/28/14 2324   Current Outpatient Prescriptions  Medication Sig Dispense Refill  . butalbital-acetaminophen-caffeine (FIORICET WITH CODEINE) 50-325-40-30 MG per capsule Take 1 capsule by mouth every 4 (four) hours as needed for migraine.     . citalopram (CELEXA) 40 MG tablet Take 40 mg by mouth at bedtime.    . clotrimazole (LOTRIMIN) 1 % cream Apply 1 application topically 2 (two) times daily as needed  (for athlete's foot).    . cyclobenzaprine (FLEXERIL) 10 MG tablet Take 10 mg by mouth 3 (three) times daily as needed for muscle spasms. For muscle    . docusate sodium (COLACE) 100 MG capsule Take 100 mg by mouth 2 (two) times daily.    . ferrous gluconate (FERGON) 324 MG tablet Take 324 mg by mouth 2 (two) times daily.     . furosemide (LASIX) 40 MG tablet Take 40 mg by mouth daily.    . hydrOXYzine (ATARAX/VISTARIL) 50 MG tablet Take 50 mg by mouth 3 (three) times daily.     Marland Kitchen loratadine (CLARITIN) 10 MG tablet Take 10 mg by mouth daily.    . medroxyPROGESTERone (DEPO-PROVERA) 150 MG/ML injection Inject 150 mg into the muscle every 3 (three) months.    . methylphenidate (CONCERTA) 54 MG CR tablet Take 54 mg by mouth every morning.    . metoprolol tartrate (LOPRESSOR) 25 MG tablet Take 25 mg by mouth 2 (two) times daily.    . naproxen (NAPROSYN) 500 MG tablet Take 500 mg by mouth daily.     Marland Kitchen omeprazole (PRILOSEC) 20 MG capsule Take 20 mg by mouth 2 (two) times daily.    . paliperidone (INVEGA) 9 MG 24 hr tablet Take 9 mg by mouth every morning.    . polyethylene glycol (MIRALAX / GLYCOLAX) packet Take 17 g by mouth daily as needed for mild constipation.     . potassium chloride (K-DUR,KLOR-CON) 10 MEQ tablet Take 10 mEq by mouth daily.    . risperiDONE (RISPERDAL) 0.5 MG tablet Take 0.5 mg by mouth at bedtime.     . phenazopyridine (PYRIDIUM) 200 MG tablet Take 1 tablet (200 mg total) by mouth 3 (three) times daily as needed for pain. (Patient not taking: Reported on 08/28/2014) 6 tablet 0  . sulfamethoxazole-trimethoprim (BACTRIM DS) 800-160 MG per tablet Take 1 tablet by mouth 2 (two) times daily. (Patient not taking: Reported on 08/28/2014) 14 tablet 0    Musculoskeletal: Strength & Muscle Tone: within normal limits Gait & Station: normal Patient leans: N/A  Psychiatric Specialty Exam: Physical Exam  Review of Systems  Constitutional: Negative.   Eyes: Negative.   Respiratory:  Negative.   Cardiovascular: Negative.   Gastrointestinal: Negative.   Genitourinary: Negative.   Musculoskeletal: Negative.   Skin: Negative.   Neurological: Negative.   Endo/Heme/Allergies: Negative.   Psychiatric/Behavioral: Positive for depression and hallucinations.    Blood pressure 106/70, pulse 70, temperature 98.4 F (36.9 C), temperature source Oral, resp. rate 18, height 5' 4"  (1.626 m), weight 134.265 kg (296 lb), SpO2 100 %.Body mass index is 50.78 kg/(m^2).  General Appearance: Casual  Eye Contact::  Fair  Speech:  Clear and Coherent  Volume:  Normal  Mood:  Negative  Affect:  Appropriate  Thought Process:  Goal Directed  Orientation:  Full (Time, Place, and Person)  Thought Content:  Negative  Suicidal Thoughts:  No  Homicidal Thoughts:  No  Memory:  Immediate;   Good Recent;   Good Remote;   Good fair and good  Judgement:  Fair  Insight:  Fair  Psychomotor Activity:  Normal  Concentration:  Fair  Recall:  [employer] of Knowledge:Fair  Language: Fair  Akathisia:  No  Handed:  Right  AIMS (if indicated):     Assets:  Communication Skills  ADL's:  Intact  Cognition: WNL  Sleep:      Medical Decision Making: Established Problem, Stable/Improving (1)  Treatment Plan Summary: Plan Discharge pt after D/C IVC and will go back to the same Group Home and will contct Case Co-ordinator for a different p[lace to stay. Hs enough meds at home  Plan:  No evidence of imminent risk to self or others at present.   Disposition: as above. Case discussed with ER Physician and staff.  Dewain Penning 08/29/2014 6:32 PM

## 2014-08-29 NOTE — ED Notes (Signed)
MD at bedside.Dr Guss [place] in with pt

## 2014-08-29 NOTE — ED Provider Notes (Signed)
-----------------------------------------   6:04 PM on 08/29/2014 -----------------------------------------  Psychiatric reviewed, Dr. Dorene Grebehawla says the patient feels the patient is not a safety risk. Patient appears to be medically and psychiatrically stable. We will discharge him home and have them follow-up with RA for further evaluation of the depression.  Clinical impression is major depression  Sharman CheekPhillip Kamel Haven, MD 08/29/14 [phone]

## 2014-08-29 NOTE — ED Notes (Signed)
BEHAVIORAL HEALTH ROUNDING  Patient sleeping: Yes  Patient alert and oriented: Sleeping  Behavior appropriate: Yes. ; If no, describe:  Nutrition and fluids offered: Sleeping  Toileting and hygiene offered: Sleeping  Sitter present: q15 minute observations and security camera monitoring  Law enforcement present: Yes ODS  

## 2014-08-29 NOTE — ED Provider Notes (Signed)
-----------------------------------------   12:59 AM on 08/29/2014 -----------------------------------------   BP 109/66 mmHg  Pulse 93  Temp(Src) 98.4 F (36.9 C) (Oral)  Resp 18  Ht 5\' 4"  (1.626 m)  Wt 296 lb (134.265 kg)  BMI 50.78 kg/m2  SpO2 100%  The patient had no acute events since last update.  Calm and cooperative at this time.  Disposition is pending per Psychiatry/Behavioral Medicine team recommendations.     Myrna Blazeravid Matthew Schaevitz, MD 08/29/14 [phone]

## 2014-08-29 NOTE — ED Notes (Signed)

## 2014-08-29 NOTE — Discharge Instructions (Signed)
Depression °Depression refers to feeling sad, low, down in the dumps, blue, gloomy, or empty. In general, there are two kinds of depression: °· Normal sadness or normal grief. This kind of depression is one that we all feel from time to time after upsetting life experiences, such as the loss of a job or the ending of a relationship. This kind of depression is considered normal, is short lived, and resolves within a few days to 2 weeks. Depression experienced after the loss of a loved one (bereavement) often lasts longer than 2 weeks but normally gets better with time. °· Clinical depression. This kind of depression lasts longer than normal sadness or normal grief or interferes with your ability to function at home, at work, and in school. It also interferes with your personal relationships. It affects almost every aspect of your life. Clinical depression is an illness. °Symptoms of depression can also be caused by conditions other than those mentioned above, such as: °· Physical illness. Some physical illnesses, including underactive thyroid gland (hypothyroidism), severe anemia, specific types of cancer, diabetes, uncontrolled seizures, heart and lung problems, strokes, and chronic pain are commonly associated with symptoms of depression. °· Side effects of some prescription medicine. In some people, certain types of medicine can cause symptoms of depression. °· Substance abuse. Abuse of alcohol and illicit drugs can cause symptoms of depression. °SYMPTOMS °Symptoms of normal sadness and normal grief include the following: °· Feeling sad or crying for short periods of time. °· Not caring about anything (apathy). °· Difficulty sleeping or sleeping too much. °· No longer able to enjoy the things you used to enjoy. °· Desire to be by oneself all the time (social isolation). °· Lack of energy or motivation. °· Difficulty concentrating or remembering. °· Change in appetite or weight. °· Restlessness or  agitation. °Symptoms of clinical depression include the same symptoms of normal sadness or normal grief and also the following symptoms: °· Feeling sad or crying all the time. °· Feelings of guilt or worthlessness. °· Feelings of hopelessness or helplessness. °· Thoughts of suicide or the desire to harm yourself (suicidal ideation). °· Loss of touch with reality (psychotic symptoms). Seeing or hearing things that are not real (hallucinations) or having false beliefs about your life or the people around you (delusions and paranoia). °DIAGNOSIS  °The diagnosis of clinical depression is usually based on how bad the symptoms are and how long they have lasted. Your health care provider will also ask you questions about your medical history and substance use to find out if physical illness, use of prescription medicine, or substance abuse is causing your depression. Your health care provider may also order blood tests. °TREATMENT  °Often, normal sadness and normal grief do not require treatment. However, sometimes antidepressant medicine is given for bereavement to ease the depressive symptoms until they resolve. °The treatment for clinical depression depends on how bad the symptoms are but often includes antidepressant medicine, counseling with a mental health professional, or both. Your health care provider will help to determine what treatment is best for you. °Depression caused by physical illness usually goes away with appropriate medical treatment of the illness. If prescription medicine is causing depression, talk with your health care provider about stopping the medicine, decreasing the dose, or changing to another medicine. °Depression caused by the abuse of alcohol or illicit drugs goes away when you stop using these substances. Some adults need professional help in order to stop drinking or using drugs. °SEEK IMMEDIATE MEDICAL   CARE IF: °· You have thoughts about hurting yourself or others. °· You lose touch  with reality (have psychotic symptoms). °· You are taking medicine for depression and have a serious side effect. °FOR MORE INFORMATION °· National Alliance on Mental Illness: www.nami.org  °· National Institute of Mental Health: www.nimh.nih.gov  °Document Released: 03/17/2000 Document Revised: 08/04/2013 Document Reviewed: 06/19/2011 °ExitCare® Patient Information ©2015 [employer]. This information is not intended to replace advice given to you by your health care provider. Make sure you discuss any questions you have with your health care provider. ° °Suicidal Feelings, How to Help Yourself °Everyone feels sad or unhappy at times, but depressing thoughts and feelings of hopelessness can lead to thoughts of suicide. It can seem as if life is too tough to handle. If you feel as though you have reached the point where suicide is the only answer, it is time to let someone know immediately.  °HOW TO COPE AND PREVENT SUICIDE °· Let family, friends, teachers, or counselors know. Get help. Try not to isolate yourself from those who care about you. Even though you may not feel sociable, talk with someone every day. It is best if it is face-to-face. Remember, they will want to help you. °· Eat a regularly spaced and well-balanced diet. °· Get plenty of rest. °· Avoid alcohol and drugs because they will only make you feel worse and may also lower your inhibitions. Remove them from the home. If you are thinking of taking an overdose of your prescribed medicines, give your medicines to someone who can give them to you one day at a time. If you are on antidepressants, let your caregiver know of your feelings so he or she can provide a safer medicine, if that is a concern. °· Remove weapons or poisons from your home. °· Try to stick to routines. Follow a schedule and remind yourself that you have to keep that schedule every day. °· Set some realistic goals and achieve them. Make a list and cross things off as you go.  Accomplishments give a sense of worth. Wait until you are feeling better before doing things you find difficult or unpleasant to do. °· If you are able, try to start exercising. Even half-hour periods of exercise each day will make you feel better. Getting out in the sun or into nature helps you recover from depression faster. If you have a favorite place to walk, take advantage of that. °· Increase safe activities that have always given you pleasure. This may include playing your favorite music, reading a good book, painting a picture, or playing your favorite instrument. Do whatever takes your mind off your depression. °· Keep your living space well-lighted. °GET HELP °Contact a suicide hotline, crisis center, or local suicide prevention center for help right away. Local centers may include a hospital, clinic, community service organization, social service provider, or health department. °· Call your local emergency services (911 in the [place]). °· Call a suicide hotline: °¨ 1-800-273-TALK (1-800-273-8255) in the [place]. °¨ 1-800-SUICIDE (1-800-784-2433) in the [place]. °¨ 1-888-628-9454 in the [place] for Spanish-speaking counselors. °¨ 1-800-799-4TTY (1-800-799-4889) in the [place] for TTY users. °· Visit the following websites for information and help: °¨ National Suicide Prevention Lifeline: www.suicidepreventionlifeline.org °¨ Hopeline: www.hopeline.com °¨ American Foundation for Suicide Prevention: www.afsp.org °· For lesbian, gay, bisexual, transgender, or questioning youth, contact The [employer]: °¨ 1-866-4-U-TREVOR (1-866-488-7386) in the [place]. °¨ www.thetrevorproject.org °· In [place], treatment resources are listed in each   province with listings available under [employer] or similar titles. Another source for Crisis Centres by Province is located at  http://www.suicideprevention.ca/in-crisis-now/find-a-crisis-centre-now/crisis-centres °Document Released: 09/24/2002 Document Revised: 06/12/2011 Document Reviewed: 07/15/2013 °ExitCare® Patient Information ©2015 [employer]. This information is not intended to replace advice given to you by your health care provider. Make sure you discuss any questions you have with your health care provider. ° °

## 2014-08-29 NOTE — ED Notes (Signed)
Ivc /pending placement 

## 2014-08-29 NOTE — ED Notes (Signed)
BEHAVIORAL HEALTH ROUNDING Patient sleeping: No. Patient alert and oriented: yes Behavior appropriate: Yes.  ; If no, describe:  Nutrition and fluids offered: Yes  Toileting and hygiene offered: Yes  Sitter present: yes Law enforcement present: Yes  

## 2014-08-29 NOTE — ED Notes (Signed)
Pt showered
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
## 2014-08-29 NOTE — ED Notes (Signed)
Patient assigned to appropriate care area. Patient oriented to unit/care area: Informed that, for their safety, care areas are designed for safety and monitored by security cameras at all times; and visiting hours explained to patient. Patient verbalizes understanding, and verbal contract for safety obtained. 

## 2014-08-29 NOTE — ED Notes (Signed)
Pt observed lying in bed, via camera security. Pt visualized with NAD. No verbalized needs or concerns at this time. Continue to monitor.  

## 2014-08-29 NOTE — ED Notes (Signed)
Pt back from x-ray.

## 2014-08-29 NOTE — ED Notes (Signed)
BEHAVIORAL HEALTH ROUNDING  Patient sleeping: No.  Patient alert and oriented: Yes  Behavior appropriate: Yes. ; If no, describe:  Nutrition and fluids offered: Yes  Toileting and hygiene offered: Yes  Sitter present: q15 minute observations and security camera monitoring  Law enforcement present: Yes ODS 

## 2014-08-29 NOTE — ED Notes (Signed)
BEHAVIORAL HEALTH ROUNDING Patient sleeping: No. Patient alert and oriented: yes Behavior appropriate: Yes.  ; If no, describe:  Nutrition and fluids offered: Yes  Toileting and hygiene offered: Yes  Sitter present: no Law enforcement present: No 

## 2014-09-30 ENCOUNTER — Ambulatory Visit: Payer: Medicaid Other | Admitting: *Deleted
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
## 2022-12-19 DIAGNOSIS — E1165 Type 2 diabetes mellitus with hyperglycemia: Secondary | ICD-10-CM | POA: Insufficient documentation

## 2022-12-22 DIAGNOSIS — F411 Generalized anxiety disorder: Secondary | ICD-10-CM | POA: Insufficient documentation

## 2022-12-22 DIAGNOSIS — I509 Heart failure, unspecified: Secondary | ICD-10-CM | POA: Insufficient documentation

## 2022-12-22 DIAGNOSIS — J4540 Moderate persistent asthma, uncomplicated: Secondary | ICD-10-CM | POA: Insufficient documentation

## 2022-12-22 DIAGNOSIS — F331 Major depressive disorder, recurrent, moderate: Secondary | ICD-10-CM | POA: Insufficient documentation

## 2022-12-22 DIAGNOSIS — F3181 Bipolar II disorder: Secondary | ICD-10-CM | POA: Insufficient documentation

## 2022-12-22 DIAGNOSIS — I10 Essential (primary) hypertension: Secondary | ICD-10-CM | POA: Insufficient documentation

## 2023-01-01 ENCOUNTER — Other Ambulatory Visit: Payer: Self-pay | Admitting: Family Medicine

## 2023-01-01 DIAGNOSIS — Z1231 Encounter for screening mammogram for malignant neoplasm of breast: Secondary | ICD-10-CM

## 2023-01-03 DIAGNOSIS — G4700 Insomnia, unspecified: Secondary | ICD-10-CM | POA: Insufficient documentation

## 2023-01-03 DIAGNOSIS — R519 Headache, unspecified: Secondary | ICD-10-CM | POA: Insufficient documentation

## 2023-01-03 DIAGNOSIS — R110 Nausea: Secondary | ICD-10-CM | POA: Insufficient documentation

## 2023-01-03 DIAGNOSIS — L309 Dermatitis, unspecified: Secondary | ICD-10-CM | POA: Insufficient documentation

## 2023-01-03 DIAGNOSIS — K5904 Chronic idiopathic constipation: Secondary | ICD-10-CM | POA: Insufficient documentation

## 2023-01-08 DIAGNOSIS — R350 Frequency of micturition: Secondary | ICD-10-CM | POA: Insufficient documentation

## 2023-01-16 ENCOUNTER — Other Ambulatory Visit (HOSPITAL_BASED_OUTPATIENT_CLINIC_OR_DEPARTMENT_OTHER): Payer: Self-pay

## 2023-01-16 ENCOUNTER — Other Ambulatory Visit: Payer: Self-pay

## 2023-01-16 ENCOUNTER — Emergency Department (HOSPITAL_BASED_OUTPATIENT_CLINIC_OR_DEPARTMENT_OTHER)
Admission: EM | Admit: 2023-01-16 | Discharge: 2023-01-16 | Disposition: A | Discharge status: Home / Self Care | Payer: MEDICAID | Attending: Emergency Medicine | Admitting: Emergency Medicine

## 2023-01-16 ENCOUNTER — Encounter (HOSPITAL_BASED_OUTPATIENT_CLINIC_OR_DEPARTMENT_OTHER): Payer: Self-pay | Admitting: Emergency Medicine

## 2023-01-16 DIAGNOSIS — R0789 Other chest pain: Secondary | ICD-10-CM | POA: Diagnosis not present

## 2023-01-16 DIAGNOSIS — N644 Mastodynia: Secondary | ICD-10-CM | POA: Diagnosis present

## 2023-01-16 DIAGNOSIS — Y92410 Unspecified street and highway as the place of occurrence of the external cause: Secondary | ICD-10-CM | POA: Insufficient documentation

## 2023-01-16 MED ORDER — IBUPROFEN 800 MG PO TABS
800.0000 mg | ORAL_TABLET | Freq: Once | ORAL | Status: AC
  Administered 2023-01-16: 800 mg via ORAL
  Filled 2023-01-16: qty 1

## 2023-01-16 MED ORDER — CYCLOBENZAPRINE HCL 10 MG PO TABS
10.0000 mg | ORAL_TABLET | Freq: Two times a day (BID) | ORAL | 0 refills | Status: DC | PRN
  Filled 2023-01-16: qty 20, 10d supply, fill #0

## 2023-01-16 MED ORDER — IBUPROFEN 800 MG PO TABS
800.0000 mg | ORAL_TABLET | Freq: Three times a day (TID) | ORAL | 0 refills | Status: DC
  Filled 2023-01-16: qty 21, 7d supply, fill #0
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
## 2023-01-16 NOTE — ED Notes (Signed)
ED Provider at bedside. 
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
## 2023-01-30 ENCOUNTER — Ambulatory Visit: Payer: No Typology Code available for payment source | Attending: Cardiology | Admitting: Cardiology

## 2023-01-30 VITALS — BP 102/78 | HR 81 | Resp 12 | Ht 64.0 in | Wt 290.8 lb

## 2023-01-30 DIAGNOSIS — I5020 Unspecified systolic (congestive) heart failure: Secondary | ICD-10-CM | POA: Diagnosis not present

## 2023-01-30 DIAGNOSIS — R072 Precordial pain: Secondary | ICD-10-CM | POA: Insufficient documentation

## 2023-01-30 MED ORDER — METOPROLOL SUCCINATE ER 25 MG PO TB24: 25.0000 mg | ORAL_TABLET | Freq: Every day | ORAL | 3 refills | Status: DC
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
## 2023-01-30 NOTE — Patient Instructions (Signed)
Medication Instructions:   STOP TAKING METOPROLOL TARTRATE NOW  STOP TAKING ISOSORBIDE (IMDUR) NOW  START TAKING METOPROLOL SUCCINATE (TOPROL XL) 25 MG BY MOUTH DAILY  *If you need a refill on your cardiac medications before your next appointment, please call your pharmacy*    Testing/Procedures:  Your physician has requested that you have an echocardiogram. Echocardiography is a painless test that uses sound waves to create images of your heart. It provides your doctor with information about the size and shape of your heart and how well your heart's chambers and valves are working. This procedure takes approximately one hour. There are no restrictions for this procedure. Please do NOT wear cologne, perfume, aftershave, or lotions (deodorant is allowed). Please arrive 15 minutes prior to your appointment time.     How to Prepare for Your Cardiac PET/CT Stress Test:  1. Please do not take these medications before your test:   Medications that may interfere with the cardiac pharmacological stress agent (ex. nitrates - including erectile dysfunction medications, isosorbide mononitrate- [please start to hold this medication the day before the test], tamulosin or beta-blockers) the day of the exam. (Erectile dysfunction medication should be held for at least 72 hrs prior to test)  DO NOT TAKE METOPROLOL SUCCINATE THE DAY OF THIS EXAM  Your remaining medications may be taken with water.  2. Nothing to eat or drink, except water, 3 hours prior to arrival time.   NO caffeine/decaffeinated products, or chocolate 12 hours prior to arrival.  3. NO perfume, cologne or lotion on chest or abdomen area.          - FEMALES - Please avoid wearing dresses to this appointment.  4. Total time is 1 to 2 hours; you may want to bring reading material for the waiting time.  5. Please report to Radiology at the [facility] Main Entrance 30 minutes early for your test.  [address]  [place], [place] [address]    Diabetic Preparation:  Hold oral medications. You may take NPH and Lantus insulin. Do not take Humalog or Humulin R (Regular Insulin) the day of your test. Check blood sugars prior to leaving the house. If able to eat breakfast prior to 3 hour fasting, you may take all medications, including your insulin, Do not worry if you miss your breakfast dose of insulin - start at your next meal. Patients who wear a continuous glucose monitor MUST remove the device prior to scanning.  IF YOU THINK YOU MAY BE PREGNANT, OR ARE NURSING PLEASE INFORM THE TECHNOLOGIST.  In preparation for your appointment, medication and supplies will be purchased.  Appointment availability is limited, so if you need to cancel or reschedule, please call the Radiology Department at [phone] [facility]) OR [phone] [facility])  24 hours in advance to avoid a cancellation fee of $100.00  What to Expect After you Arrive:  Once you arrive and check in for your appointment, you will be taken to a preparation room within the Radiology Department.  A technologist or Nurse will obtain your medical history, verify that you are correctly prepped for the exam, and explain the procedure.  Afterwards,  an IV will be started in your arm and electrodes will be placed on your skin for EKG monitoring during the stress portion of the exam. Then you will be escorted to the PET/CT scanner.  There, staff will get you positioned on the scanner and obtain a blood pressure and EKG.  During the exam, you  will continue to be connected to the EKG and blood pressure machines.  A small, safe amount of a radioactive tracer will be injected in your IV to obtain a series of pictures of your heart along with an injection of a stress agent.    After your Exam:  It is recommended that you eat a meal and drink a caffeinated beverage to counter act any effects of the stress agent.  Drink plenty of fluids for the remainder of  the day and urinate frequently for the first couple of hours after the exam.  Your doctor will inform you of your test results within 7-10 business days.  For more information and frequently asked questions, please visit our website : [email or web address]  For questions about your test or how to prepare for your test, please call: Cardiac Imaging Nurse Navigators Office: [phone]     Follow-Up: At [facility], you and your health needs are our priority.  As part of our continuing mission to provide you with exceptional heart care, we have created designated Provider Care Teams.  These Care Teams include your primary Cardiologist (physician) and Advanced Practice Providers (APPs -  Physician Assistants and Nurse Practitioners) who all work together to provide you with the care you need, when you need it.  We recommend signing up for the patient portal called "MyChart".  Sign up information is provided on this After Visit Summary.  MyChart is used to connect with patients for Virtual Visits (Telemedicine).  Patients are able to view lab/test results, encounter notes, upcoming appointments, etc.  Non-urgent messages can be sent to your provider as well.   To learn more about what you can do with MyChart, go to [email or web address].    Your next appointment:   6 month(s)  Provider:   Jari Favre, PA-C, Ronie Spies, PA-C, Robin Searing, NP, Jacolyn Reedy, PA-C, Eligha Bridegroom, NP, Tereso Newcomer, PA-C, or Perlie Gold, PA-C

## 2023-03-06 ENCOUNTER — Ambulatory Visit (HOSPITAL_COMMUNITY): Payer: MEDICAID

## 2023-03-20 ENCOUNTER — Telehealth (HOSPITAL_COMMUNITY): Payer: Self-pay | Admitting: *Deleted

## 2023-03-20 NOTE — Telephone Encounter (Signed)
Attempted to call patient regarding upcoming cardiac PET appointment. Left message on voicemail with name and callback number Johney Frame RN Navigator Cardiac Imaging [facility] [phone] Office

## 2023-03-21 ENCOUNTER — Ambulatory Visit (HOSPITAL_COMMUNITY)
Admission: RE | Admit: 2023-03-21 | Discharge: 2023-03-21 | Disposition: A | Discharge status: Home / Self Care | Payer: MEDICAID | Source: Ambulatory Visit | Attending: Cardiology | Admitting: Cardiology

## 2023-03-21 DIAGNOSIS — R072 Precordial pain: Secondary | ICD-10-CM | POA: Insufficient documentation

## 2023-03-21 LAB — NM PET CT CARDIAC PERFUSION MULTI W/ABSOLUTE BLOODFLOW
LV dias vol: 158 mL (ref 46–106)
LV sys vol: 97 mL
MBFR: 2.19
Nuc Rest EF: 39 %
Nuc Stress EF: 47 %
Rest MBF: 1.02 ml/g/min
Rest Nuclear Isotope Dose: 29.5 mCi
ST Depression (mm): 0 mm
Stress MBF: 2.23 ml/g/min
Stress Nuclear Isotope Dose: 29.2 mCi

## 2023-03-21 MED ORDER — REGADENOSON 0.4 MG/5ML IV SOLN
0.4000 mg | Freq: Once | INTRAVENOUS | Status: AC
  Administered 2023-03-21: 0.4 mg via INTRAVENOUS

## 2023-03-21 MED ORDER — RUBIDIUM RB82 GENERATOR (RUBYFILL)
29.2300 | PACK | Freq: Once | INTRAVENOUS | Status: AC
  Administered 2023-03-21: 29.23 via INTRAVENOUS

## 2023-03-21 MED ORDER — RUBIDIUM RB82 GENERATOR (RUBYFILL)
29.5300 | PACK | Freq: Once | INTRAVENOUS | Status: AC
  Administered 2023-03-21: 29.53 via INTRAVENOUS

## 2023-03-21 MED ORDER — REGADENOSON 0.4 MG/5ML IV SOLN
INTRAVENOUS | Status: AC
  Filled 2023-03-21: qty 5

## 2023-03-21 NOTE — Progress Notes (Signed)
PET stress suggests no ischemia or scar. Heart function is reduced but need to correlate with echo. Low risk

## 2023-04-12 ENCOUNTER — Ambulatory Visit (HOSPITAL_COMMUNITY): Payer: MEDICAID | Attending: Cardiology

## 2023-04-12 DIAGNOSIS — I5020 Unspecified systolic (congestive) heart failure: Secondary | ICD-10-CM | POA: Insufficient documentation

## 2023-04-12 LAB — ECHOCARDIOGRAM COMPLETE
Area-P 1/2: 4.46 cm2
S' Lateral: 3.8 cm

## 2023-04-12 NOTE — Progress Notes (Signed)
 Heart function is low normal.  Knowing history of HFrEF< this could possibly be improved EF. Continue current medications. I am not sure she will tolerate additional GDMT due to lightheadedness and low normal blood pressures. Recommend f/u w/pharmD for uptitration of GDMT, if tolerated.  Thanks MJP

## 2023-04-13 ENCOUNTER — Telehealth: Payer: Self-pay | Admitting: *Deleted

## 2023-04-13 DIAGNOSIS — I5020 Unspecified systolic (congestive) heart failure: Secondary | ICD-10-CM

## 2023-04-13 DIAGNOSIS — Z79899 Other long term (current) drug therapy: Secondary | ICD-10-CM

## 2023-04-13 NOTE — Telephone Encounter (Signed)
 The patient has been notified of the result and verbalized understanding.  All questions (if any) were answered.  Pt aware we will refer her to our Pharmacist here in the office for uptitration of GDMT, if tolerated.   She is aware I will place the referral and send a message to their scheduler to call her back to arrange this appt.   Pt verbalized understanding and agrees with this plan.

## 2023-04-13 NOTE — Telephone Encounter (Signed)
-----   Message from Nurse Roxie PARAS sent at 04/13/2023  8:17 AM EST -----  ----- Message ----- From: Elmira Newman PARAS, MD Sent: 04/12/2023   4:43 PM EST To: [facility] Triage  Heart function is low normal.  Knowing history of HFrEF< this could possibly be improved EF. Continue current medications. I am not sure she will tolerate additional GDMT due to lightheadedness and low normal blood pressures. Recommend f/u w/pharmD for uptitration of GDMT, if tolerated.  Thanks MJP

## 2023-05-11 ENCOUNTER — Encounter (HOSPITAL_COMMUNITY): Payer: Self-pay | Admitting: Emergency Medicine

## 2023-05-11 ENCOUNTER — Ambulatory Visit (HOSPITAL_COMMUNITY)
Admission: EM | Admit: 2023-05-11 | Discharge: 2023-05-11 | Disposition: A | Discharge status: Home / Self Care | Payer: MEDICAID

## 2023-05-11 DIAGNOSIS — R609 Edema, unspecified: Secondary | ICD-10-CM | POA: Diagnosis not present

## 2023-05-11 DIAGNOSIS — J45901 Unspecified asthma with (acute) exacerbation: Secondary | ICD-10-CM

## 2023-05-11 DIAGNOSIS — I509 Heart failure, unspecified: Secondary | ICD-10-CM | POA: Diagnosis not present

## 2023-05-11 DIAGNOSIS — R0602 Shortness of breath: Secondary | ICD-10-CM | POA: Diagnosis not present

## 2023-05-11 NOTE — Discharge Instructions (Signed)
 At this time I recommend that you go to the ED for further evaluation.
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
## 2023-05-11 NOTE — ED Triage Notes (Signed)
 Pt reports that she just got out of psych hospital and hasn't been able to sleep. Pt has asthma and reports having SOB since Monday. States that using inhaler but not helping.  Pt c/o abd pains and headaches. Pt reports doesn't have an appetite, but eating pretzels during triage.

## 2023-05-11 NOTE — ED Provider Notes (Signed)
 [facility]    CSN: [id number] Arrival date & time: 05/11/23  1734      History   Chief Complaint Chief Complaint  Patient presents with   Shortness of Breath    HPI [name] is a 43 y.o. female.   HPI  Patient is here alone, without guardian or caregiver   Patient states her asthma is exacerbated and her blood pressure is very high  She states she has been using a pump which is like an inhaler  She states her breathing has been an issue since Sept 2024.   She states she does not have medication for her heart - she denies having her Entresto  or metoprolol .  She states her heart hurts - reports pain under her left breast. She reports it gets worse with exertion  She states her feet are swelling and she feels very swollen everywhere    Past Medical History:  Diagnosis Date   Bipolar 1 disorder (HCC)    Diabetes mellitus without complication (HCC)    H/O tracheostomy    Hx of emotional problems    Mental retardation    Obesity    Schizophrenia (HCC)     Patient Active Problem List   Diagnosis Date Noted   Precordial pain 01/30/2023   HFrEF (heart failure with reduced ejection fraction) (HCC) 01/30/2023   Acute upper respiratory infection 06/02/2008   COSTOCHONDRITIS 06/02/2008   GASTROENTERITIS 04/17/2008   TINEA PEDIS 03/24/2008   LUMBAGO 03/24/2008   Dermatophytosis of body 01/07/2008   OBESITY, MORBID 09/13/2006   Mixed bipolar I disorder (HCC) 09/13/2006   Attention deficit hyperactivity disorder (ADHD) 09/13/2006   MENTAL RETARDATION, MODERATE 09/13/2006   GERD 09/13/2006   CELLULITIS, LEG, LEFT 09/13/2006   UTI 10/02/2005   ANEMIA-IRON DEFICIENCY 09/29/2005   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      Home Medications    Prior to Admission medications   Medication Sig  Start Date End Date Taking? Authorizing Provider  bumetanide (BUMEX) 2 MG tablet Take 2 mg by mouth daily. 12/22/22   [provider]  clonazePAM (KLONOPIN) 0.5 MG tablet Take 0.5 mg by mouth 2 (two) times daily. 12/22/22   [provider]  clotrimazole  (LOTRIMIN ) 1 % cream Apply 1 application topically 2 (two) times daily as needed (for athlete's foot).    [provider]  docusate sodium (COLACE) 100 MG capsule Take 100 mg by mouth 2 (two) times daily.    [provider]  ENTRESTO  24-26 MG Take 1 tablet by mouth 2 (two) times daily. 12/22/22   [provider]  famotidine  (PEPCID ) 20 MG tablet Take 20 mg by mouth 2 (two) times daily. 12/22/22   [provider]  FEROSUL 325 (65 Fe) MG tablet Take 325 mg by mouth daily with breakfast. 01/09/23   [provider]  hydrOXYzine  (ATARAX /VISTARIL ) 50 MG tablet Take 50 mg by mouth 3 (three) times daily.     [provider]  lamoTRIgine (LAMICTAL) 100 MG tablet Take 100 mg by mouth daily. 12/22/22   [provider]  Melatonin 10 MG TABS Take 1 tablet every day by oral route at bedtime for 30 days. 01/03/23   [provider]  metoprolol  succinate (TOPROL  XL) 25 MG 24 hr tablet Take 1 tablet (25 mg  total) by mouth daily. 01/30/23   Patwardhan, Newman PARAS, MD  naproxen (NAPROSYN) 500 MG tablet Take 500 mg by mouth daily.     [provider]  omeprazole (PRILOSEC) 20 MG capsule Take 20 mg by mouth 2 (two) times daily.    [provider]  paliperidone  (INVEGA ) 9 MG 24 hr tablet Take 9 mg by mouth every morning.    [provider]  phenazopyridine  (PYRIDIUM ) 200 MG tablet Take 1 tablet (200 mg total) by mouth 3 (three) times daily as needed for pain. 04/24/12   Harris, Abigail, PA-C  Semaglutide-Weight Management (WEGOVY) 1 MG/0.5ML SOAJ Inject 1 mg into the skin once a week. 01/08/23   [provider]  spironolactone  (ALDACTONE ) 25 MG tablet Take 25  mg by mouth daily. 12/22/22   [provider]  TRINTELLIX 20 MG TABS tablet Take 20 mg by mouth daily. 12/22/22   [provider]    Family History No family history on file.  Social History Social History   Tobacco Use   Smoking status: Former    Current packs/day: 0.00    Types: Cigarettes    Quit date: 08/07/2005    Years since quitting: 17.7   Smokeless tobacco: Never  Substance Use Topics   Alcohol use: No   Drug use: No     Allergies   Penicillins   Review of Systems Review of Systems  Respiratory:  Positive for shortness of breath and wheezing.   Cardiovascular:  Positive for chest pain, palpitations and leg swelling.     Physical Exam Triage Vital Signs ED Triage Vitals  Encounter Vitals Group     BP 05/11/23 1854 (!) 186/103     Systolic BP Percentile --      Diastolic BP Percentile --      Pulse Rate 05/11/23 1854 (!) 104     Resp 05/11/23 1854 19     Temp 05/11/23 1854 99.5 F (37.5 C)     Temp Source 05/11/23 1854 Oral     SpO2 05/11/23 1854 96 %     Weight --      Height --      Head Circumference --      Peak Flow --      Pain Score 05/11/23 1852 10     Pain Loc --      Pain Education --      Exclude from Growth Chart --    No data found.  Updated Vital Signs BP (!) 186/103 (BP Location: Right Arm)   Pulse (!) 104   Temp 99.5 F (37.5 C) (Oral)   Resp 19   SpO2 96%   Visual Acuity Right Eye Distance:   Left Eye Distance:   Bilateral Distance:    Right Eye Near:   Left Eye Near:    Bilateral Near:     Physical Exam Vitals reviewed.  Constitutional:      General: She is awake.     Appearance: Normal appearance. She is well-developed and well-groomed. She is obese.  HENT:     Head: Normocephalic and atraumatic.  Cardiovascular:     Rate and Rhythm: Tachycardia present. Rhythm irregular.     Pulses: Decreased pulses.          Radial pulses are 1+ on the right side and 1+ on the left side.     Heart sounds:  Heart sounds are distant.  Pulmonary:     Effort: Tachypnea present.     Breath  sounds: Examination of the right-lower field reveals decreased breath sounds. Examination of the left-lower field reveals decreased breath sounds. Decreased breath sounds and wheezing present. No rhonchi or rales.  Musculoskeletal:     Cervical back: Normal range of motion.     Right lower leg: Edema present.     Left lower leg: Edema present.  Neurological:     Mental Status: She is alert.  Psychiatric:        Attention and Perception: Attention normal.        Mood and Affect: Mood normal.        Speech: Speech normal.        Behavior: Behavior normal. Behavior is cooperative.      UC Treatments / Results  Labs (all labs ordered are listed, but only abnormal results are displayed) Labs Reviewed - No data to display  EKG   Radiology No results found.  Procedures Procedures (including critical care time)  Medications Ordered in UC Medications - No data to display  Initial Impression / Assessment and Plan / UC Course  I have reviewed the triage vital signs and the nursing notes.  Pertinent labs & imaging results that were available during my care of the patient were reviewed by me and considered in my medical decision making (see chart for details).      Final Clinical Impressions(s) / UC Diagnoses   Final diagnoses:  SOB (shortness of breath)  Chronic congestive heart failure, unspecified heart failure type (HCC)  Edema, unspecified type  Exacerbation of asthma, unspecified asthma severity, unspecified whether persistent   Acute, new concern Patient reports that she has not had her blood pressure or congestive heart medications for some time.  She states that she is having trouble breathing, palpitations, worsening chest pain and lower extremity edema.  She is not accompanied by guardian or or caregiver today so I am unsure if this is accurate.  She does have reduced breath sounds in  the lower lung lobes and wheezing.  She is tachycardic and her blood pressure is elevated today in the clinic.  Given her chronic medical history and potential for involvement I am recommending that she goes to the emergency room for further evaluation and management.  She is in agreement with this plan and declines EMS transportation.   Discharge Instructions      At this time I recommend that you go to the ED for further evaluation.      ED Prescriptions   None    PDMP not reviewed this encounter.   Jazzmyn Filion E, PA-C 05/11/23 [id number]

## 2023-05-15 ENCOUNTER — Telehealth: Payer: Self-pay | Admitting: Cardiology

## 2023-05-15 ENCOUNTER — Other Ambulatory Visit: Payer: Self-pay | Admitting: Nurse Practitioner

## 2023-05-15 DIAGNOSIS — E785 Hyperlipidemia, unspecified: Secondary | ICD-10-CM | POA: Insufficient documentation

## 2023-05-15 DIAGNOSIS — R109 Unspecified abdominal pain: Secondary | ICD-10-CM

## 2023-05-15 DIAGNOSIS — U071 COVID-19: Secondary | ICD-10-CM | POA: Insufficient documentation

## 2023-05-15 NOTE — Telephone Encounter (Signed)
PCP office and NP, [name] is calling to speak to provider in regards to medication change of Eliquis.

## 2023-05-23 ENCOUNTER — Ambulatory Visit: Payer: MEDICAID

## 2023-05-26 ENCOUNTER — Emergency Department (HOSPITAL_COMMUNITY)
Admission: EM | Admit: 2023-05-26 | Discharge: 2023-05-26 | Disposition: A | Discharge status: Home / Self Care | Payer: MEDICAID | Attending: Emergency Medicine | Admitting: Emergency Medicine

## 2023-05-26 ENCOUNTER — Encounter (HOSPITAL_COMMUNITY): Payer: Self-pay

## 2023-05-26 ENCOUNTER — Other Ambulatory Visit: Payer: Self-pay

## 2023-05-26 DIAGNOSIS — I509 Heart failure, unspecified: Secondary | ICD-10-CM | POA: Insufficient documentation

## 2023-05-26 DIAGNOSIS — Z794 Long term (current) use of insulin: Secondary | ICD-10-CM | POA: Insufficient documentation

## 2023-05-26 DIAGNOSIS — D72829 Elevated white blood cell count, unspecified: Secondary | ICD-10-CM | POA: Insufficient documentation

## 2023-05-26 DIAGNOSIS — F989 Unspecified behavioral and emotional disorders with onset usually occurring in childhood and adolescence: Secondary | ICD-10-CM | POA: Insufficient documentation

## 2023-05-26 DIAGNOSIS — F69 Unspecified disorder of adult personality and behavior: Secondary | ICD-10-CM

## 2023-05-26 DIAGNOSIS — Z7984 Long term (current) use of oral hypoglycemic drugs: Secondary | ICD-10-CM | POA: Diagnosis not present

## 2023-05-26 DIAGNOSIS — E119 Type 2 diabetes mellitus without complications: Secondary | ICD-10-CM | POA: Insufficient documentation

## 2023-05-26 LAB — BASIC METABOLIC PANEL
Anion gap: 16 — ABNORMAL HIGH (ref 5–15)
BUN: 6 mg/dL (ref 6–20)
CO2: 21 mmol/L — ABNORMAL LOW (ref 22–32)
Calcium: 9.4 mg/dL (ref 8.9–10.3)
Chloride: 103 mmol/L (ref 98–111)
Creatinine, Ser: 0.41 mg/dL — ABNORMAL LOW (ref 0.44–1.00)
GFR, Estimated: >60 mL/min (ref >60)
Glucose, Bld: 104 mg/dL — ABNORMAL HIGH (ref 70–99)
Potassium: 5 mmol/L (ref 3.5–5.1)
Sodium: 140 mmol/L (ref 135–145)

## 2023-05-26 LAB — CBC
HCT: 39.1 % (ref 36.0–46.0)
Hemoglobin: 13.4 g/dL (ref 12.0–15.0)
MCH: 24.7 pg — ABNORMAL LOW (ref 26.0–34.0)
MCHC: 34.3 g/dL (ref 30.0–36.0)
MCV: 72.1 fL — ABNORMAL LOW (ref 80.0–100.0)
Platelets: 320 10*3/uL (ref 150–400)
RBC: 5.42 MIL/uL — ABNORMAL HIGH (ref 3.87–5.11)
RDW: 14.9 % (ref 11.5–15.5)
WBC: 12.5 10*3/uL — ABNORMAL HIGH (ref 4.0–10.5)
nRBC: 0 % (ref 0.0–0.2)

## 2023-05-26 LAB — HCG, SERUM, QUALITATIVE: Preg, Serum: NEGATIVE

## 2023-05-26 LAB — CBG MONITORING, ED: Glucose-Capillary: 93 mg/dL (ref 70–99)

## 2023-05-26 MED ORDER — ACETAMINOPHEN 325 MG PO TABS
650.0000 mg | ORAL_TABLET | Freq: Once | ORAL | Status: AC
  Administered 2023-05-26: 650 mg via ORAL
  Filled 2023-05-26: qty 2

## 2023-05-26 NOTE — ED Notes (Signed)
 Unsuccessful venipuncture, nurse made aware.   KM

## 2023-05-26 NOTE — ED Triage Notes (Signed)
 Pt [place] up by EMS [place] [facility] for not having medications for past 3 months. Hx of bipolar, HTN, MI. 218 palpated SBP per EMS. Pt reports feeling weak and falling 5 times today. Unknown if patient is on thinners. (-) LOC, (-) hit head.  NAD noted.

## 2023-05-26 NOTE — ED Notes (Signed)
Legal guardian at bedside. 

## 2023-05-26 NOTE — Discharge Instructions (Addendum)
 [name]'s labs were reassuring today as were her vital signs and physical exam. Return to the ER with any new severe symptoms.

## 2023-05-26 NOTE — ED Provider Notes (Signed)
 [place] EMERGENCY DEPARTMENT AT [facility] Provider Note   CSN: [id number] Arrival date & time: 05/26/23  0133     History {Add pertinent medical, surgical, social history, OB history to HPI:1} Chief Complaint  Patient presents with   Weakness   Fall   Medication Refill   Hypertension    [name] is a 43 y.o. female.  HPI     Home Medications Prior to Admission medications   Medication Sig Start Date End Date Taking? Authorizing Provider  bumetanide (BUMEX) 2 MG tablet Take 2 mg by mouth daily. 12/22/22   [provider]  clonazePAM (KLONOPIN) 0.5 MG tablet Take 0.5 mg by mouth 2 (two) times daily. 12/22/22   [provider]  clotrimazole (LOTRIMIN) 1 % cream Apply 1 application topically 2 (two) times daily as needed (for athlete's foot).    [provider]  docusate sodium (COLACE) 100 MG capsule Take 100 mg by mouth 2 (two) times daily.    [provider]  ENTRESTO 24-26 MG Take 1 tablet by mouth 2 (two) times daily. 12/22/22   [provider]  famotidine (PEPCID) 20 MG tablet Take 20 mg by mouth 2 (two) times daily. 12/22/22   [provider]  FEROSUL 325 (65 Fe) MG tablet Take 325 mg by mouth daily with breakfast. 01/09/23   [provider]  hydrOXYzine (ATARAX/VISTARIL) 50 MG tablet Take 50 mg by mouth 3 (three) times daily.     [provider]  lamoTRIgine (LAMICTAL) 100 MG tablet Take 100 mg by mouth daily. 12/22/22   [provider]  Melatonin 10 MG TABS Take 1 tablet every day by oral route at bedtime for 30 days. 01/03/23   [provider]  metoprolol succinate (TOPROL XL) 25 MG 24 hr tablet Take 1 tablet (25 mg total) by mouth daily. 01/30/23   Patwardhan, Anabel Bene, MD  naproxen (NAPROSYN) 500 MG tablet Take 500 mg by mouth daily.     [provider]  omeprazole (PRILOSEC) 20 MG capsule Take 20 mg by mouth 2 (two) times daily.    [provider]   paliperidone (INVEGA) 9 MG 24 hr tablet Take 9 mg by mouth every morning.    [provider]  phenazopyridine (PYRIDIUM) 200 MG tablet Take 1 tablet (200 mg total) by mouth 3 (three) times daily as needed for pain. 04/24/12   Arthor Captain, PA-C  Semaglutide-Weight Management (WEGOVY) 1 MG/0.5ML SOAJ Inject 1 mg into the skin once a week. 01/08/23   [provider]  spironolactone (ALDACTONE) 25 MG tablet Take 25 mg by mouth daily. 12/22/22   [provider]  TRINTELLIX 20 MG TABS tablet Take 20 mg by mouth daily. 12/22/22   [provider]      Allergies    Penicillins    Review of Systems   Review of Systems  Physical Exam Updated Vital Signs BP (!) 140/99 (BP Location: Right Arm)   Pulse (!) 108   Temp 98.7 F (37.1 C) (Oral)   Resp 18   Ht 5\' 3"  (1.6 m)   Wt 95.3 kg   SpO2 95%   BMI 37.20 kg/m  Physical Exam  ED Results / Procedures / Treatments   Labs (all labs ordered are listed, but only abnormal results are displayed) Labs Reviewed  BASIC METABOLIC PANEL  CBC  URINALYSIS, ROUTINE W REFLEX MICROSCOPIC  HCG, SERUM, QUALITATIVE  CBG MONITORING, ED    EKG EKG Interpretation Date/Time:  Saturday May 26 2023 02:41:06 EST Ventricular Rate:  98 PR Interval:  128 QRS Duration:  82 QT Interval:  394 QTC Calculation: 503 R Axis:   92  Text Interpretation: Normal sinus rhythm Rightward axis Prolonged QT Abnormal ECG When compared with ECG of 30-Jan-2023 10:12, PREVIOUS ECG IS PRESENT Confirmed by Gilda Crease [phone]) on 05/26/2023 2:43:15 AM  Radiology No results found.  Procedures Procedures  {Document cardiac monitor, telemetry assessment procedure when appropriate:1}  Medications Ordered in ED Medications - No data to display  ED Course/ Medical Decision Making/ A&P   {   Click here for ABCD2, HEART and other calculatorsREFRESH Note before signing :1}                              Medical Decision  Making Amount and/or Complexity of Data Reviewed Labs: ordered.   ***  {Document critical care time when appropriate:1} {Document review of labs and clinical decision tools ie heart score, Chads2Vasc2 etc:1}  {Document your independent review of radiology images, and any outside records:1} {Document your discussion with family members, caretakers, and with consultants:1} {Document social determinants of health affecting pt's care:1} {Document your decision making why or why not admission, treatments were needed:1} Final Clinical Impression(s) / ED Diagnoses Final diagnoses:  None    Rx / DC Orders ED Discharge Orders     None

## 2023-05-28 DIAGNOSIS — Z0000 Encounter for general adult medical examination without abnormal findings: Secondary | ICD-10-CM | POA: Insufficient documentation

## 2023-05-28 DIAGNOSIS — N939 Abnormal uterine and vaginal bleeding, unspecified: Secondary | ICD-10-CM | POA: Insufficient documentation

## 2023-05-28 DIAGNOSIS — R7301 Impaired fasting glucose: Secondary | ICD-10-CM | POA: Insufficient documentation

## 2023-05-28 DIAGNOSIS — Z124 Encounter for screening for malignant neoplasm of cervix: Secondary | ICD-10-CM | POA: Insufficient documentation

## 2023-05-28 DIAGNOSIS — Z01419 Encounter for gynecological examination (general) (routine) without abnormal findings: Secondary | ICD-10-CM | POA: Insufficient documentation

## 2023-05-28 DIAGNOSIS — Z1231 Encounter for screening mammogram for malignant neoplasm of breast: Secondary | ICD-10-CM | POA: Insufficient documentation

## 2023-06-13 ENCOUNTER — Ambulatory Visit
Admission: RE | Admit: 2023-06-13 | Discharge: 2023-06-13 | Disposition: A | Discharge status: Home / Self Care | Payer: MEDICAID | Source: Ambulatory Visit | Attending: Nurse Practitioner | Admitting: Nurse Practitioner

## 2023-06-13 DIAGNOSIS — R109 Unspecified abdominal pain: Secondary | ICD-10-CM

## 2023-06-27 ENCOUNTER — Ambulatory Visit
Admission: RE | Admit: 2023-06-27 | Discharge: 2023-06-27 | Disposition: A | Discharge status: Home / Self Care | Payer: MEDICAID | Source: Ambulatory Visit | Attending: Family Medicine | Admitting: Family Medicine

## 2023-06-27 ENCOUNTER — Other Ambulatory Visit: Payer: Self-pay | Admitting: Nurse Practitioner

## 2023-06-27 ENCOUNTER — Other Ambulatory Visit: Payer: Self-pay | Admitting: Family Medicine

## 2023-06-27 DIAGNOSIS — N644 Mastodynia: Secondary | ICD-10-CM

## 2023-06-27 DIAGNOSIS — Z1231 Encounter for screening mammogram for malignant neoplasm of breast: Secondary | ICD-10-CM

## 2023-08-14 ENCOUNTER — Encounter (HOSPITAL_BASED_OUTPATIENT_CLINIC_OR_DEPARTMENT_OTHER): Payer: Self-pay | Admitting: Internal Medicine

## 2023-08-14 DIAGNOSIS — R0683 Snoring: Secondary | ICD-10-CM

## 2023-08-14 DIAGNOSIS — R0681 Apnea, not elsewhere classified: Secondary | ICD-10-CM

## 2023-08-31 DIAGNOSIS — N644 Mastodynia: Secondary | ICD-10-CM | POA: Insufficient documentation

## 2023-08-31 DIAGNOSIS — Z0100 Encounter for examination of eyes and vision without abnormal findings: Secondary | ICD-10-CM | POA: Insufficient documentation

## 2023-08-31 DIAGNOSIS — R102 Pelvic and perineal pain: Secondary | ICD-10-CM | POA: Insufficient documentation

## 2023-09-06 ENCOUNTER — Ambulatory Visit
Admission: EM | Admit: 2023-09-06 | Discharge: 2023-09-06 | Disposition: A | Discharge status: Home / Self Care | Payer: MEDICAID

## 2023-09-06 ENCOUNTER — Encounter: Payer: Self-pay | Admitting: Emergency Medicine

## 2023-09-06 ENCOUNTER — Other Ambulatory Visit: Payer: Self-pay

## 2023-09-06 DIAGNOSIS — S60442A External constriction of right middle finger, initial encounter: Secondary | ICD-10-CM

## 2023-09-06 NOTE — ED Provider Notes (Signed)
 [facility]    CSN: [id number] Arrival date & time: 09/06/23  1707      History   Chief Complaint Chief Complaint  Patient presents with   Hand Pain    Ring struck on right middle finger since June 1    HPI [name] is a 43 y.o. female.   [name] is a 43 y.o. female presents with pain and swelling of the right middle finger.  She has a ring that has been on her finger for the past 4 days which she has been unable to remove with supportive measures at home.  No numbness or tingling.  No injury to the hand or fingers.   The following portions of the patient's history were reviewed and updated as appropriate: allergies, current medications, past family history, past medical history, past social history, past surgical history, and problem list.    Past Medical History:  Diagnosis Date   Bipolar 1 disorder (HCC)    Diabetes mellitus without complication (HCC)    H/O tracheostomy    Hx of emotional problems    Mental retardation    Obesity    Schizophrenia (HCC)     Patient Active Problem List   Diagnosis Date Noted   Precordial pain 01/30/2023   HFrEF (heart failure with reduced ejection fraction) (HCC) 01/30/2023   Acute upper respiratory infection 06/02/2008   COSTOCHONDRITIS 06/02/2008   GASTROENTERITIS 04/17/2008   TINEA PEDIS 03/24/2008   LUMBAGO 03/24/2008   Dermatophytosis of body 01/07/2008   OBESITY, MORBID 09/13/2006   Mixed bipolar I disorder (HCC) 09/13/2006   Attention deficit hyperactivity disorder (ADHD) 09/13/2006   MENTAL RETARDATION, MODERATE 09/13/2006   GERD 09/13/2006   CELLULITIS, LEG, LEFT 09/13/2006   UTI 10/02/2005   ANEMIA-IRON DEFICIENCY 09/29/2005   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      Home Medications    Prior to Admission medications    Medication Sig Start Date End Date Taking? Authorizing Provider  bumetanide (BUMEX) 2 MG tablet Take 2 mg by mouth daily. 12/22/22   [provider]  clonazePAM (KLONOPIN) 0.5 MG tablet Take 0.5 mg by mouth 2 (two) times daily. 12/22/22   [provider]  clotrimazole (LOTRIMIN) 1 % cream Apply 1 application topically 2 (two) times daily as needed (for athlete's foot).    [provider]  docusate sodium (COLACE) 100 MG capsule Take 100 mg by mouth 2 (two) times daily.    [provider]  ENTRESTO 24-26 MG Take 1 tablet by mouth 2 (two) times daily. 12/22/22   [provider]  famotidine (PEPCID) 20 MG tablet Take 20 mg by mouth 2 (two) times daily. 12/22/22   [provider]  FEROSUL 325 (65 Fe) MG tablet Take 325 mg by mouth daily with breakfast. 01/09/23   [provider]  hydrOXYzine  (ATARAX /VISTARIL ) 50 MG tablet Take 50 mg by mouth 3 (three) times daily.     [provider]  lamoTRIgine (LAMICTAL) 100 MG tablet Take 100 mg by mouth daily. 12/22/22   [provider]  Melatonin 10 MG TABS Take 1 tablet every day by oral route at bedtime for 30 days. 01/03/23   [provider]  metoprolol  succinate (TOPROL  XL) 25 MG 24 hr tablet Take 1 tablet (25 mg total)  by mouth daily. 01/30/23   Patwardhan, Kaye Parsons, MD  naproxen (NAPROSYN) 500 MG tablet Take 500 mg by mouth daily.     [provider]  omeprazole (PRILOSEC) 20 MG capsule Take 20 mg by mouth 2 (two) times daily.    [provider]  paliperidone  (INVEGA ) 9 MG 24 hr tablet Take 9 mg by mouth every morning.    [provider]  phenazopyridine  (PYRIDIUM ) 200 MG tablet Take 1 tablet (200 mg total) by mouth 3 (three) times daily as needed for pain. 04/24/12   Harris, Abigail, PA-C  Semaglutide-Weight Management (WEGOVY) 1 MG/0.5ML SOAJ Inject 1 mg into the skin once a week. 01/08/23   [provider]  spironolactone (ALDACTONE) 25  MG tablet Take 25 mg by mouth daily. 12/22/22   [provider]  TRINTELLIX 20 MG TABS tablet Take 20 mg by mouth daily. 12/22/22   [provider]    Family History Family History  Problem Relation Age of Onset   BRCA 1/2 Neg Hx    Breast cancer Neg Hx     Social History Social History   Tobacco Use   Smoking status: Former    Current packs/day: 0.00    Types: Cigarettes    Quit date: 08/07/2005    Years since quitting: 18.0   Smokeless tobacco: Never  Substance Use Topics   Alcohol use: No   Drug use: No     Allergies   Penicillins   Review of Systems Review of Systems  Musculoskeletal:  Positive for joint swelling.  Skin:  Positive for wound.  Neurological:  Negative for weakness and numbness.  All other systems reviewed and are negative.    Physical Exam Triage Vital Signs ED Triage Vitals [09/06/23 1739]  Encounter Vitals Group     BP 126/82     Systolic BP Percentile      Diastolic BP Percentile      Pulse Rate 78     Resp 20     Temp 97.9 F (36.6 C)     Temp Source Oral     SpO2 98 %     Weight      Height      Head Circumference      Peak Flow      Pain Score      Pain Loc      Pain Education      Exclude from Growth Chart    No data found.  Updated Vital Signs BP 126/82 (BP Location: Left Arm)   Pulse 78   Temp 97.9 F (36.6 C) (Oral)   Resp 20   SpO2 98%   Visual Acuity Right Eye Distance:   Left Eye Distance:   Bilateral Distance:    Right Eye Near:   Left Eye Near:    Bilateral Near:     Physical Exam Vitals reviewed.  Constitutional:      General: She is not in acute distress.    Appearance: Normal appearance. She is not toxic-appearing.  HENT:     Head: Normocephalic.     Mouth/Throat:     Mouth: Mucous membranes are moist.  Eyes:     Conjunctiva/sclera: Conjunctivae normal.  Cardiovascular:     Rate and Rhythm: Normal rate and regular rhythm.     Heart sounds: Normal heart sounds.  Pulmonary:      Effort: Pulmonary effort is normal.     Breath sounds: Normal breath sounds.  Musculoskeletal:  General: Normal range of motion.       Hands:     Comments: Ring noted to the right middle finger.  Swelling prevents removal of the ring.  Sensation intact.  Full strength.  Neurovascularly intact.  Skin:    General: Skin is warm and dry.  Neurological:     General: No focal deficit present.     Mental Status: She is alert and oriented to person, place, and time.      UC Treatments / Results  Labs (all labs ordered are listed, but only abnormal results are displayed) Labs Reviewed - No data to display  EKG   Radiology No results found.  Procedures Procedures (including critical care time)  Procedure Note: Ring Removal with Ring Cutter  Procedure: Ring removal using ring cutter Indication: Constriction of finger due to tight ring, causing pain and/or swelling  Consent: Informed consent obtained. Risks, benefits, and alternatives discussed with the patient.  Description: The right middle finger was examined and found to be swollen with impaired circulation distal to the ring. A manual ring cutter was used to carefully cut through the metal band. The ring was successfully split and removed without complication. Care was taken to avoid skin injury during the procedure.  Outcome: Ring was removed intact. No bleeding noted. Abrasion noted to finger. Antibiotic ointment applied and band-aid applied. Capillary refill and sensation were intact post-procedure.    Medications Ordered in UC Medications - No data to display  Initial Impression / Assessment and Plan / UC Course  I have reviewed the triage vital signs and the nursing notes.  Pertinent labs & imaging results that were available during my care of the patient were reviewed by me and considered in my medical decision making (see chart for details).     Patient presents with finger constriction caused by a  tight ring, resulting in localized swelling and discomfort. The ring was successfully removed using a manual ring cutter without complication. Post-removal exam showed intact capillary refill, preserved sensation, and no evidence of vascular compromise. The patient was advised to monitor for persistent swelling, increased pain, numbness, or any signs of skin damage. No further intervention is required at this time.  Today's evaluation has revealed no signs of a dangerous process. Discussed diagnosis with patient and/or guardian. Patient and/or guardian aware of their diagnosis, possible red flag symptoms to watch out for and need for close follow up. Patient and/or guardian understands verbal and written discharge instructions. Patient and/or guardian comfortable with plan and disposition.  Patient and/or guardian has a clear mental status at this time, good insight into illness (after discussion and teaching) and has clear judgment to make decisions regarding their care  Documentation was completed with the aid of voice recognition software. Transcription may contain typographical errors.  Final Clinical Impressions(s) / UC Diagnoses   Final diagnoses:  External constriction of right middle finger, initial encounter     Discharge Instructions      You were seen today for finger swelling caused by a tight ring. The ring was safely removed using a manual ring cutter. After removal, your circulation and sensation in the finger were intact, and there were no signs of serious injury.  To reduce swelling and discomfort, you may apply ice to the finger for 15 to 20 minutes every few hours during the first day. Keep your hand elevated as much as possible and avoid using the affected finger for heavy tasks until the swelling improves. Over-the-counter pain relievers  like ibuprofen  or acetaminophen  can be used if needed for pain.  Watch for signs of worsening symptoms such as increased swelling, worsening  pain, numbness, discoloration, or signs of infection like redness, warmth, or drainage from the skin. If you notice any of these symptoms, contact your primary care provider.   Go to the emergency room if you develop sudden or severe pain, loss of finger movement, or if the finger becomes pale, cold, or blue.    ED Prescriptions   None    PDMP not reviewed this encounter.   Maryruth Sol, [place] 09/06/23 1750

## 2023-09-06 NOTE — ED Triage Notes (Signed)
 Pt st's ring on right middle has been stuck for 4 days  Pt c/o pain and swelling to finger

## 2023-09-06 NOTE — Discharge Instructions (Addendum)
 You were seen today for finger swelling caused by a tight ring. The ring was safely removed using a manual ring cutter. After removal, your circulation and sensation in the finger were intact, and there were no signs of serious injury.  To reduce swelling and discomfort, you may apply ice to the finger for 15 to 20 minutes every few hours during the first day. Keep your hand elevated as much as possible and avoid using the affected finger for heavy tasks until the swelling improves. Over-the-counter pain relievers like ibuprofen  or acetaminophen  can be used if needed for pain.  Watch for signs of worsening symptoms such as increased swelling, worsening pain, numbness, discoloration, or signs of infection like redness, warmth, or drainage from the skin. If you notice any of these symptoms, contact your primary care provider.   Go to the emergency room if you develop sudden or severe pain, loss of finger movement, or if the finger becomes pale, cold, or blue.

## 2023-10-02 DIAGNOSIS — B3731 Acute candidiasis of vulva and vagina: Secondary | ICD-10-CM | POA: Insufficient documentation

## 2023-10-10 ENCOUNTER — Other Ambulatory Visit: Payer: Self-pay

## 2023-10-10 ENCOUNTER — Other Ambulatory Visit (HOSPITAL_COMMUNITY): Payer: Self-pay

## 2023-10-10 ENCOUNTER — Encounter: Payer: Self-pay | Admitting: Cardiology

## 2023-10-10 ENCOUNTER — Ambulatory Visit: Payer: MEDICAID | Attending: Cardiology | Admitting: Cardiology

## 2023-10-10 VITALS — BP 119/81 | HR 88 | Ht 63.0 in | Wt 328.0 lb

## 2023-10-10 DIAGNOSIS — I5020 Unspecified systolic (congestive) heart failure: Secondary | ICD-10-CM | POA: Diagnosis present

## 2023-10-10 MED ORDER — FUROSEMIDE 40 MG PO TABS: 40.0000 mg | ORAL_TABLET | ORAL | 3 refills | Status: DC | PRN

## 2023-10-10 MED ORDER — SPIRONOLACTONE 25 MG PO TABS
25.0000 mg | ORAL_TABLET | Freq: Every day | ORAL | 3 refills | Status: DC
  Filled 2023-10-10 – 2023-11-15 (×4): qty 90, 90d supply, fill #0
  Filled 2024-01-23 – 2024-01-24 (×2): qty 90, 90d supply, fill #1

## 2023-10-10 MED ORDER — ENTRESTO 24-26 MG PO TABS: 1.0000 | ORAL_TABLET | Freq: Two times a day (BID) | ORAL | 2 refills | Status: DC

## 2023-10-10 MED ORDER — ENTRESTO 24-26 MG PO TABS
1.0000 | ORAL_TABLET | Freq: Two times a day (BID) | ORAL | 2 refills | Status: DC
  Filled 2023-10-10 – 2023-11-15 (×4): qty 60, 30d supply, fill #0
  Filled 2024-01-23: qty 60, 30d supply, fill #1
  Filled ????-??-??: fill #1

## 2023-10-10 MED ORDER — SPIRONOLACTONE 25 MG PO TABS: 25.0000 mg | ORAL_TABLET | Freq: Every day | ORAL | 3 refills | Status: DC

## 2023-10-10 MED ORDER — METOPROLOL SUCCINATE ER 25 MG PO TB24
25.0000 mg | ORAL_TABLET | Freq: Every day | ORAL | 3 refills | Status: DC
  Filled 2023-10-10 – 2023-11-15 (×4): qty 90, 90d supply, fill #0
  Filled 2024-01-23 (×2): qty 90, 90d supply, fill #1

## 2023-10-10 MED ORDER — METOPROLOL SUCCINATE ER 25 MG PO TB24: 25.0000 mg | ORAL_TABLET | Freq: Every day | ORAL | 3 refills | Status: DC

## 2023-10-10 MED ORDER — FUROSEMIDE 40 MG PO TABS
40.0000 mg | ORAL_TABLET | ORAL | 3 refills | Status: AC | PRN
Start: 2023-10-10 — End: ?
  Filled 2023-10-10 – 2024-01-24 (×5): qty 30, 30d supply, fill #0

## 2023-10-10 NOTE — Progress Notes (Signed)
 Cardiology Office Note:  .   Date:  10/10/2023  ID:  [name], DOB 1980/10/16, MRN [id number] PCP: Loretha Richerd SAUNDERS, MD  [facility] Providers Cardiologist:  Newman Lawrence, MD PCP: Loretha Richerd SAUNDERS, MD  Chief Complaint  Patient presents with   Congestive Heart Failure      History of Present Illness: .    [name] is a 43 y.o. female with bipolar disorder, h/o HFrEF  Patient lives at a group home, is here with her caregiver Rosina.  Patient reportedly was seen by cardiologist in [place], I do not have the records available to me.  She is on medications for HFrEF.  She has had occasional lightheadedness, reports drinking less than a liter of water.  Reviewed recent echocardiogram and stress results with the patient, details below.  Vitals:   10/10/23 0848  BP: 119/81  Pulse: 88  SpO2: 96%      ROS:  Review of Systems  Cardiovascular:  Positive for chest pain and dyspnea on exertion. Negative for leg swelling, palpitations and syncope.     Studies Reviewed: SABRA       EKG 01/30/2023: Normal sinus rhythm Normal ECG When compared with ECG of 05-Oct-2005 05:05, Vent. rate has decreased BY  51 BPM Nonspecific T wave abnormality no longer evident in Anterior leads  Labs 05/2023: Hb 13.4 Cr 0.41   Echocardiogram 04/2023: 1. Left ventricular ejection fraction, by estimation, is 50 to 55%. The  left ventricle has low normal function. Left ventricular endocardial  border not optimally defined to evaluate regional wall motion. Left  ventricular diastolic parameters were  normal.   2. Right ventricular systolic function is normal. The right ventricular  size is normal. There is normal pulmonary artery systolic pressure. The  estimated right ventricular systolic pressure is 24.7 mmHg.   3. The mitral valve is degenerative. Trivial mitral valve regurgitation.  No evidence of mitral stenosis.   4. The aortic valve is normal in structure.  Aortic valve regurgitation is  not visualized. No aortic stenosis is present.   5. The inferior vena cava is normal in size with greater than 50%  respiratory variability, suggesting right atrial pressure of 3 mmHg.   6. Consider limited study with definity contrast to better define  endocardiacl segments.   Stress test 03/2023:   LV perfusion is normal. There is no evidence of ischemia. There is evidence of infarction.   Rest left ventricular function is abnormal. Rest global function is moderately reduced. There were no regional wall motion abnormalities. Rest EF: 39%. Stress EF: 47%. End diastolic cavity size is mildly enlarged. End systolic cavity size is mildly enlarged.   Myocardial blood flow was computed to be 1.24ml/g/min at rest and 2.44ml/g/min at stress. Global myocardial blood flow reserve was 2.19 and was normal.   Coronary calcium was present on the attenuation correction CT images. Mild coronary calcifications were present. Coronary calcifications were present in the left circumflex artery distribution(s).   Findings are consistent with no ischemia and no infarction. The study is low risk.   Normal perfusion and MBF reserve No TID Abnormal resting and stress EF suggest MRI/Echo correlation. Only mild LCX calcium noted   1. No significant noncardiac supplemental findings. 2. Mild mosaic attenuation pattern within the lung bases which may be seen with small airways disease.    Physical Exam:   Physical Exam Vitals and nursing note reviewed.  Constitutional:      General: She is not in  acute distress. 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  Cardiovascular:     Rate and Rhythm: Normal rate and regular rhythm.     Heart sounds: Normal heart sounds. No murmur heard. Pulmonary:     Effort: Pulmonary effort is normal.     Breath sounds: Normal breath sounds. No wheezing or rales.  Musculoskeletal:     Right lower leg: No edema.      Left lower leg: No edema.      VISIT DIAGNOSES:   ICD-10-CM   1. HFrEF (heart failure with reduced ejection fraction) (HCC)  I50.20 AMB Referral to [facility] Pharm-D        ASSESSMENT AND PLAN: .    [name] is a 43 y.o. female with bipolar disorder, h/o HFrEF, referred for chest pain  HFrEF: Reported prior history, no records available.  She is on GDMT for HFrEF.  EF 50 to 55% in 04/2023.  Continue current GDMT, including Entresto  24-26 mg twice daily, metoprolol  succinate 25 mg daily, spironolactone  25 mg daily.   Start Bumex 2 mg daily, and instead take Lasix  40 mg only as. Follow-up with Pharm.D. in a few weeks to assess if Entresto  can be uptitrated after stopping daily usage of Bumex.    Chest pain: Resolved.  No ischemia on stress testing.   Meds ordered this encounter  Medications   DISCONTD: metoprolol  succinate (TOPROL  XL) 25 MG 24 hr tablet    Sig: Take 1 tablet (25 mg total) by mouth daily.    Dispense:  90 tablet    Refill:  3   DISCONTD: ENTRESTO  24-26 MG    Sig: Take 1 tablet by mouth 2 (two) times daily.    Dispense:  30 tablet    Refill:  2   DISCONTD: spironolactone  (ALDACTONE ) 25 MG tablet    Sig: Take 1 tablet (25 mg total) by mouth daily.    Dispense:  90 tablet    Refill:  3   DISCONTD: furosemide  (LASIX ) 40 MG tablet    Sig: Take 1 tablet (40 mg total) by mouth as needed for fluid.    Dispense:  30 tablet    Refill:  3   ENTRESTO  24-26 MG    Sig: Take 1 tablet by mouth 2 (two) times daily.    Dispense:  60 tablet    Refill:  2    shoudl be #60 per secure chat with provider 10/10/23 ALB   furosemide  (LASIX ) 40 MG tablet    Sig: Take 1 tablet (40 mg total) by mouth as needed for fluid.    Dispense:  30 tablet    Refill:  3   metoprolol  succinate (TOPROL  XL) 25 MG 24 hr tablet    Sig: Take 1 tablet (25 mg total) by mouth daily.    Dispense:  90 tablet    Refill:  3   spironolactone  (ALDACTONE ) 25 MG tablet    Sig: Take 1 tablet (25  mg total) by mouth daily.    Dispense:  90 tablet    Refill:  3     F/u in 6 months  Signed, Newman JINNY Lawrence, MD

## 2023-10-10 NOTE — Patient Instructions (Signed)
 Medication Instructions:  STOP Bumex START Lasix  40 mg as needed for fluid retention  *If you need a refill on your cardiac medications before your next appointment, please call your pharmacy*  Testing/Procedures: PHARM D REFERRAL IN 4-6 WEEKS   Follow-Up: At [facility], you and your health needs are our priority.  As part of our continuing mission to provide you with exceptional heart care, our providers are all part of one team.  This team includes your primary Cardiologist (physician) and Advanced Practice Providers or APPs (Physician Assistants and Nurse Practitioners) who all work together to provide you with the care you need, when you need it.  Your next appointment:   6 months   Provider:   Newman JINNY Lawrence, MD    We recommend signing up for the patient portal called MyChart.  Sign up information is provided on this After Visit Summary.  MyChart is used to connect with patients for Virtual Visits (Telemedicine).  Patients are able to view lab/test results, encounter notes, upcoming appointments, etc.  Non-urgent messages can be sent to your provider as well.   To learn more about what you can do with MyChart, go to [email or web address].

## 2023-10-15 ENCOUNTER — Encounter: Payer: Self-pay | Admitting: Emergency Medicine

## 2023-10-15 ENCOUNTER — Ambulatory Visit
Admission: EM | Admit: 2023-10-15 | Discharge: 2023-10-15 | Disposition: A | Discharge status: Home / Self Care | Payer: MEDICAID

## 2023-10-15 DIAGNOSIS — H1032 Unspecified acute conjunctivitis, left eye: Secondary | ICD-10-CM

## 2023-10-15 MED ORDER — POLYMYXIN B-TRIMETHOPRIM 10000-0.1 UNIT/ML-% OP SOLN: 1.0000 [drp] | OPHTHALMIC | 0 refills | Status: AC

## 2023-10-15 NOTE — ED Triage Notes (Signed)
 Pt's left eye red since yesterday  Pt c/o watering and itching

## 2023-10-15 NOTE — ED Provider Notes (Signed)
 [facility]    CSN: [id number] Arrival date & time: 10/15/23  1735      History   Chief Complaint Chief Complaint  Patient presents with   Eye Problem    HPI [name] is a 43 y.o. female.   Patient presents today with a caregiver for evaluation of left eye redness and itching with drainage.  She denies any injury.  She has not any fever or other symptoms.  She does not report any visual disturbance.  The history is provided by the patient and a caregiver.  Eye Problem Associated symptoms: discharge, itching and redness   Associated symptoms: no nausea, no photophobia and no vomiting     Past Medical History:  Diagnosis Date   Bipolar 1 disorder (HCC)    Diabetes mellitus without complication (HCC)    H/O tracheostomy    Hx of emotional problems    Mental retardation    Obesity    Schizophrenia (HCC)     Patient Active Problem List   Diagnosis Date Noted   Candidiasis of vagina 10/02/2023   Pain in pelvis 08/31/2023   Encounter for examination of eyes and vision without abnormal findings 08/31/2023   Pain of breast 08/31/2023   Abnormal uterine bleeding 05/28/2023   Encounter for general adult medical examination without abnormal findings 05/28/2023   Encounter for gynecological examination (general) (routine) without abnormal findings 05/28/2023   Encounter for screening for malignant neoplasm of cervix 05/28/2023   Encounter for screening mammogram for malignant neoplasm of breast 05/28/2023   Impaired fasting glucose 05/28/2023   Abdominal pain 05/15/2023   Acute COVID-19 05/15/2023   Hyperlipidemia 05/15/2023   Precordial pain 01/30/2023   Increased frequency of urination 01/08/2023   Chronic idiopathic constipation 01/03/2023   Eczema 01/03/2023   Headache 01/03/2023   Insomnia 01/03/2023   Nausea 01/03/2023   Congestive heart failure (HCC) 12/22/2022   Bipolar II disorder (HCC) 12/22/2022   Generalized anxiety disorder 12/22/2022    Essential hypertension 12/22/2022   Moderate persistent asthma 12/22/2022   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/2022   Uncontrolled type 2 diabetes mellitus with hyperglycemia (HCC) 12/19/2022   Acute upper respiratory infection 06/02/2008   COSTOCHONDRITIS 06/02/2008   GASTROENTERITIS 04/17/2008   TINEA PEDIS 03/24/2008   LUMBAGO 03/24/2008   Dermatophytosis of body 01/07/2008   Severe obesity (HCC) 09/13/2006   Mixed bipolar I disorder (HCC) 09/13/2006   Attention deficit hyperactivity disorder (ADHD) 09/13/2006   MENTAL RETARDATION, MODERATE 09/13/2006   Gastroesophageal reflux disease 09/13/2006   CELLULITIS, LEG, LEFT 09/13/2006   Acute urinary tract infection 10/02/2005   Iron deficiency anemia 09/29/2005   Other acquired absence of organ 09/20/2005   Obstructive sleep apnea syndrome 01/16/2005    Past Surgical History:  Procedure Laterality Date   TONSILLECTOMY      OB History   No obstetric history on file.      Home Medications    Prior to Admission medications   Medication Sig Start Date End Date Taking? Authorizing Provider  albuterol (VENTOLIN HFA) 108 (90 Base) MCG/ACT inhaler Inhale 1-2 puffs into the lungs every 6 (six) hours as needed. 10/02/23  Yes [provider]  bumetanide (BUMEX) 2 MG tablet Take 2 mg by mouth daily. as directed 07/23/23  Yes [provider]  empagliflozin (JARDIANCE) 10 MG TABS tablet Take 10 mg by mouth daily. 05/15/23  Yes [provider]  fluconazole  (DIFLUCAN ) 150 MG tablet Take 150 mg by mouth once. 10/02/23  Yes [provider]  metoprolol  succinate (TOPROL -XL) 100 MG 24 hr tablet Take 100 mg by mouth daily. 10/02/23  Yes [provider]  nitrofurantoin , macrocrystal-monohydrate, (MACROBID ) 100 MG capsule Take 100 mg by mouth 2 (two) times daily. 08/31/23  Yes [provider]  ondansetron (ZOFRAN-ODT) 4 MG disintegrating tablet Take 4 mg by mouth once. 10/02/23  Yes  [provider]  trimethoprim -polymyxin b  (POLYTRIM ) ophthalmic solution Place 1 drop into the left eye every 4 (four) hours for 7 days. 10/15/23 10/22/23 Yes Billy Asberry FALCON, PA-C  clonazePAM (KLONOPIN) 0.5 MG tablet Take 0.5 mg by mouth 2 (two) times daily. 12/22/22   [provider]  clotrimazole (LOTRIMIN) 1 % cream Apply 1 application topically 2 (two) times daily as needed (for athlete's foot).    [provider]  docusate sodium (COLACE) 100 MG capsule Take 100 mg by mouth 2 (two) times daily.    [provider]  ENTRESTO  24-26 MG Take 1 tablet by mouth 2 (two) times daily. 10/10/23   Patwardhan, Newman PARAS, MD  famotidine (PEPCID) 20 MG tablet Take 20 mg by mouth 2 (two) times daily. 12/22/22   [provider]  FEROSUL 325 (65 Fe) MG tablet Take 325 mg by mouth daily with breakfast. 01/09/23   [provider]  furosemide  (LASIX ) 40 MG tablet Take 1 tablet (40 mg total) by mouth as needed for fluid. 10/10/23   Patwardhan, Newman PARAS, MD  hydrOXYzine  (ATARAX /VISTARIL ) 50 MG tablet Take 50 mg by mouth 3 (three) times daily.     [provider]  lamoTRIgine (LAMICTAL) 100 MG tablet Take 100 mg by mouth daily. 12/22/22   [provider]  Melatonin 10 MG TABS Take 1 tablet every day by oral route at bedtime for 30 days. 01/03/23   [provider]  metoprolol  succinate (TOPROL  XL) 25 MG 24 hr tablet Take 1 tablet (25 mg total) by mouth daily. 10/10/23   Patwardhan, Newman PARAS, MD  naproxen (NAPROSYN) 500 MG tablet Take 500 mg by mouth daily.     [provider]  omeprazole (PRILOSEC) 20 MG capsule Take 20 mg by mouth 2 (two) times daily.    [provider]  paliperidone  (INVEGA ) 9 MG 24 hr tablet Take 9 mg by mouth every morning.    [provider]  phenazopyridine  (PYRIDIUM ) 200 MG tablet Take 1 tablet (200 mg total) by mouth 3 (three) times daily as needed for pain. 04/24/12   Harris, Abigail, PA-C   Semaglutide-Weight Management (WEGOVY) 1 MG/0.5ML SOAJ Inject 1 mg into the skin once a week. 01/08/23   [provider]  spironolactone  (ALDACTONE ) 25 MG tablet Take 1 tablet (25 mg total) by mouth daily. 10/10/23   Patwardhan, Manish J, MD  TRINTELLIX 20 MG TABS tablet Take 20 mg by mouth daily. 12/22/22   [provider]    Family History Family History  Problem Relation Age of Onset   BRCA 1/2 Neg Hx    Breast cancer Neg Hx     Social History Social History   Tobacco Use   Smoking status: Former    Current packs/day: 0.00    Types: Cigarettes    Quit date: 08/07/2005    Years since quitting: 18.2   Smokeless tobacco: Never  Substance Use Topics   Alcohol use: No   Drug use: No     Allergies   Penicillins   Review of Systems Review of Systems  Constitutional:  Negative for chills and  fever.  Eyes:  Positive for discharge, redness and itching. Negative for photophobia and visual disturbance.  Gastrointestinal:  Negative for abdominal pain, nausea and vomiting.     Physical Exam Triage Vital Signs ED Triage Vitals  Encounter Vitals Group     BP 10/15/23 1746 123/84     Girls Systolic BP Percentile --      Girls Diastolic BP Percentile --      Boys Systolic BP Percentile --      Boys Diastolic BP Percentile --      Pulse Rate 10/15/23 1746 86     Resp 10/15/23 1746 20     Temp 10/15/23 1746 98.9 F (37.2 C)     Temp Source 10/15/23 1746 Oral     SpO2 10/15/23 1746 96 %     Weight --      Height --      Head Circumference --      Peak Flow --      Pain Score 10/15/23 1747 8     Pain Loc --      Pain Education --      Exclude from Growth Chart --    No data found.  Updated Vital Signs BP 123/84 (BP Location: Left Arm)   Pulse 86   Temp 98.9 F (37.2 C) (Oral)   Resp 20   SpO2 96%   Visual Acuity Right Eye Distance:   Left Eye Distance:   Bilateral Distance:    Right Eye Near:   Left Eye Near:    Bilateral Near:      Physical Exam Vitals and nursing note reviewed.  Constitutional:      General: She is not in acute distress.    Appearance: Normal appearance. She is not ill-appearing.  HENT:     Head: Normocephalic and atraumatic.  Eyes:     Extraocular Movements: Extraocular movements intact.     Pupils: Pupils are equal, round, and reactive to light.     Comments: Left conjunctiva injected, right conjunctiva normal, no photophobia noted  Cardiovascular:     Rate and Rhythm: Normal rate.  Pulmonary:     Effort: Pulmonary effort is normal. No respiratory distress.  Neurological:     Mental Status: She is alert.  Psychiatric:        Mood and Affect: Mood normal.        Behavior: Behavior normal.        Thought Content: Thought content normal.      UC Treatments / Results  Labs (all labs ordered are listed, but only abnormal results are displayed) Labs Reviewed - No data to display  EKG   Radiology No results found.  Procedures Procedures (including critical care time)  Medications Ordered in UC Medications - No data to display  Initial Impression / Assessment and Plan / UC Course  I have reviewed the triage vital signs and the nursing notes.  Pertinent labs & imaging results that were available during my care of the patient were reviewed by me and considered in my medical decision making (see chart for details).    Will treat to cover conjunctivitis with Polytrim  drops.  Advise follow-up if no gradual improvement with any worsening symptoms.  Final Clinical Impressions(s) / UC Diagnoses   Final diagnoses:  Acute conjunctivitis of left eye, unspecified acute conjunctivitis type   Discharge Instructions   None    ED Prescriptions     Medication Sig Dispense Auth. Provider   trimethoprim -polymyxin b  (POLYTRIM )  ophthalmic solution Place 1 drop into the left eye every 4 (four) hours for 7 days. 10 mL Billy Asberry FALCON, PA-C      PDMP not reviewed this encounter.    Billy Asberry FALCON, PA-C 10/15/23 [phone]

## 2023-10-15 NOTE — ED Notes (Signed)
Unable to obtain visual acuity.

## 2023-10-22 ENCOUNTER — Other Ambulatory Visit (HOSPITAL_COMMUNITY): Payer: Self-pay

## 2023-10-31 ENCOUNTER — Other Ambulatory Visit (HOSPITAL_COMMUNITY): Payer: Self-pay

## 2023-10-31 ENCOUNTER — Ambulatory Visit: Payer: MEDICAID

## 2023-10-31 ENCOUNTER — Ambulatory Visit
Admission: RE | Admit: 2023-10-31 | Discharge: 2023-10-31 | Disposition: A | Discharge status: Home / Self Care | Payer: MEDICAID | Source: Ambulatory Visit | Attending: Nurse Practitioner | Admitting: Nurse Practitioner

## 2023-10-31 DIAGNOSIS — N644 Mastodynia: Secondary | ICD-10-CM

## 2023-11-01 ENCOUNTER — Ambulatory Visit (HOSPITAL_BASED_OUTPATIENT_CLINIC_OR_DEPARTMENT_OTHER): Payer: MEDICAID | Admitting: Obstetrics & Gynecology

## 2023-11-06 NOTE — Procedures (Signed)
 Orders only

## 2023-11-15 ENCOUNTER — Other Ambulatory Visit (HOSPITAL_COMMUNITY): Payer: Self-pay

## 2023-11-16 ENCOUNTER — Other Ambulatory Visit: Payer: Self-pay

## 2023-11-21 ENCOUNTER — Ambulatory Visit: Payer: MEDICAID | Attending: Pharmacist | Admitting: Pharmacist

## 2023-11-21 NOTE — Progress Notes (Deleted)
 Patient ID: [name]                 DOB: [birth year]                      MRN: [id number]     HPI: [name] is a 43 y.o. female referred by Dr. Elmira to pharmacy clinic for HF medication management. PMH is significant for ***. Most recent LVEF *** on ***.  Discussion with patient today included the following: cardiac medication indications, introduction to GDMT clinic, reasoning behind medication titration, importance of medication adherence, and patient engagement. . At last visit with MD ***. Symptomatically, she is feeling ***, *** dizziness, lightheadedness, and fatigue. *** chest pain or palpitations. Feels SOB when ***. Able to complete all ADLs. Activity level ***. She *** checks her weight at home (normal range *** - *** lbs). *** LEE, PND, or orthopnea. Appetite has been ***. She *** adheres to a low-salt diet.  @overview @  Stopped bumex?    Current CHF meds: Entresto  24/26mg  twice a day, Jardiance 10mg  daily, metoprolol  ??, spironolactone  25mg  daily, furosemide  40mg  as needed Previously tried:  Adherence Assessment  Do you ever forget to take your medication? [] Yes [] No  Do you ever skip doses due to side effects? [] Yes [] No  Do you have trouble affording your medicines? [] Yes [] No  Are you ever unable to pick up your medication due to transportation difficulties? [] Yes [] No  Do you ever stop taking your medications because you don't believe they are helping? [] Yes [] No  Do you check your weight daily? [] Yes [] No   Adherence strategy: ***  Barriers to obtaining medications: ***  BP goal:   Family History:   Social History:   Diet:   Exercise:   Home BP readings:   Wt Readings from Last 3 Encounters:  10/10/23 (!) 328 lb (148.8 kg)  05/26/23 210 lb (95.3 kg)  01/30/23 290 lb 12.8 oz (131.9 kg)   BP Readings from Last 3 Encounters:  10/15/23 123/84  10/10/23 119/81  09/06/23 126/82   Pulse Readings from Last 3 Encounters:  10/15/23  86  10/10/23 88  09/06/23 78    Renal function: CrCl cannot be calculated (Patient's most recent lab result is older than the maximum 21 days allowed.).  Past Medical History:  Diagnosis Date   Bipolar 1 disorder (HCC)    Diabetes mellitus without complication (HCC)    H/O tracheostomy    Hx of emotional problems    Mental retardation    Obesity    Schizophrenia (HCC)     Current Outpatient Medications on File Prior to Visit  Medication Sig Dispense Refill   albuterol (VENTOLIN HFA) 108 (90 Base) MCG/ACT inhaler Inhale 1-2 puffs into the lungs every 6 (six) hours as needed.     bumetanide (BUMEX) 2 MG tablet Take 2 mg by mouth daily. as directed     clonazePAM (KLONOPIN) 0.5 MG tablet Take 0.5 mg by mouth 2 (two) times daily.     clotrimazole (LOTRIMIN) 1 % cream Apply 1 application topically 2 (two) times daily as needed (for athlete's foot).     docusate sodium (COLACE) 100 MG capsule Take 100 mg by mouth 2 (two) times daily.     empagliflozin (JARDIANCE) 10 MG TABS tablet Take 10 mg by mouth daily.     ENTRESTO  24-26 MG Take 1 tablet by mouth 2 (two) times daily. 60 tablet 2   famotidine (PEPCID) 20 MG  tablet Take 20 mg by mouth 2 (two) times daily.     FEROSUL 325 (65 Fe) MG tablet Take 325 mg by mouth daily with breakfast.     fluconazole  (DIFLUCAN ) 150 MG tablet Take 150 mg by mouth once.     furosemide  (LASIX ) 40 MG tablet Take 1 tablet (40 mg total) by mouth as needed for fluid. 30 tablet 3   hydrOXYzine  (ATARAX /VISTARIL ) 50 MG tablet Take 50 mg by mouth 3 (three) times daily.      lamoTRIgine (LAMICTAL) 100 MG tablet Take 100 mg by mouth daily.     Melatonin 10 MG TABS Take 1 tablet every day by oral route at bedtime for 30 days.     metoprolol  succinate (TOPROL  XL) 25 MG 24 hr tablet Take 1 tablet (25 mg total) by mouth daily. 90 tablet 3   metoprolol  succinate (TOPROL -XL) 100 MG 24 hr tablet Take 100 mg by mouth daily.     naproxen (NAPROSYN) 500 MG tablet Take 500 mg  by mouth daily.      nitrofurantoin , macrocrystal-monohydrate, (MACROBID ) 100 MG capsule Take 100 mg by mouth 2 (two) times daily.     omeprazole (PRILOSEC) 20 MG capsule Take 20 mg by mouth 2 (two) times daily.     ondansetron (ZOFRAN-ODT) 4 MG disintegrating tablet Take 4 mg by mouth once.     paliperidone  (INVEGA ) 9 MG 24 hr tablet Take 9 mg by mouth every morning.     phenazopyridine  (PYRIDIUM ) 200 MG tablet Take 1 tablet (200 mg total) by mouth 3 (three) times daily as needed for pain. 6 tablet 0   Semaglutide-Weight Management (WEGOVY) 1 MG/0.5ML SOAJ Inject 1 mg into the skin once a week.     spironolactone  (ALDACTONE ) 25 MG tablet Take 1 tablet (25 mg total) by mouth daily. 90 tablet 3   TRINTELLIX 20 MG TABS tablet Take 20 mg by mouth daily.     No current facility-administered medications on file prior to visit.    Allergies  Allergen Reactions   Penicillins Hives and Dermatitis     Assessment/Plan:  1. CHF -  No problem-specific Assessment & Plan notes found for this encounter.  @MTPCOMPLETEDLIST @  {f/u with PharmD:[id number]}  Thank you   Aeralyn Barna D Arnie Maiolo, Pharm.JONETTA SARAN, CPP [facility] A Division of [facility] [address] [place], [place] [address]  Phone: [phone]; Fax: [phone]

## 2023-12-07 ENCOUNTER — Ambulatory Visit (HOSPITAL_BASED_OUTPATIENT_CLINIC_OR_DEPARTMENT_OTHER): Payer: MEDICAID | Attending: Nurse Practitioner | Admitting: Internal Medicine

## 2023-12-07 DIAGNOSIS — G4733 Obstructive sleep apnea (adult) (pediatric): Secondary | ICD-10-CM | POA: Diagnosis present

## 2023-12-07 DIAGNOSIS — R0683 Snoring: Secondary | ICD-10-CM

## 2023-12-07 DIAGNOSIS — R0681 Apnea, not elsewhere classified: Secondary | ICD-10-CM

## 2023-12-16 NOTE — Procedures (Signed)
  Indications for Polysomnography The patient is a 43 year-old Female who is 5' 3 and weighs 291.0 lbs. Her BMI equals 51.6.  A full night polysomnogram was performed to evaluate for -.  MedicationNo Data. Polysomnogram Data A full night polysomnogram recorded the standard physiologic parameters including EEG, EOG, EMG, EKG, nasal and oral airflow.  Respiratory parameters of chest and abdominal movements were recorded with Respiratory Inductance Plethysmography belts.   Oxygen saturation was recorded by pulse oximetry.  Sleep Architecture The total recording time of the polysomnogram was 382.5 minutes.  The total sleep time was 286.5 minutes.  The patient spent 2.6% of total sleep time in Stage N1, 78.7% in Stage N2, 3.1% in Stages N3, and 15.5% in REM.  Sleep latency was 13.5 minutes.   REM latency was 105.0 minutes.  Sleep Efficiency was 74.9%.  Wake after Sleep Onset time was 82.5 minutes.  Respiratory Events The polysomnogram revealed a presence of - obstructive, 2 central, and - mixed apneas resulting in an Apnea index of 0.4 events per hour.  There were 34 hypopneas (GreaterEqual to3% desaturation and/or arousal) resulting in an Apnea\Hypopnea Index (AHI  GreaterEqual to3% desaturation and/or arousal) of 7.5 events per hour.  There were 24 hypopneas (GreaterEqual to4% desaturation) resulting in an Apnea\Hypopnea Index (AHI GreaterEqual to4% desaturation) of 5.4 events per hour.  There were - Respiratory  Effort Related Arousals resulting in a RERA index of - events per hour. The Respiratory Disturbance Index is 7.5 events per hour.  The snore index was 62.6 events per hour.  Mean oxygen saturation was 94.5%.  The lowest oxygen saturation during sleep was 82.0%.  Time spent LessEqual to88% oxygen saturation was  minutes ().  Limb Activity There were 7 total limb movements recorded, of this total, - were classified as PLMs.  PLM index was - per hour and PLM associated with Arousals index was  - per hour.  Cardiac Summary The average pulse rate was 81.9 bpm.  The minimum pulse rate was 32.0 bpm while the maximum pulse rate was 101.0 bpm.  Cardiac rhythm was normal/abnormal.  Comments:  Diagnosis:  Recommendations:   This study was personally reviewed and electronically signed by: REGGY SALT MD Accredited Board Certified in Sleep Medicine Date/Time:

## 2023-12-16 NOTE — Procedures (Signed)
 [facility] [address] [place], [place] [address] Tel: [phone]   Fax: [phone]  Polysomnography Interpretation  Patient Name:  [name], [name] Date:  12/07/2023 Referring Physician:  TAKELA ANDERSON [phone]) %%startinterp%% Indications for Polysomnography The patient is a 43 year-old Female who is 5' 3 and weighs 291.0 lbs. Her BMI equals 51.6.  A full night polysomnogram was performed to evaluate for -.OSA  Medication  No Data.   Polysomnogram Data A full night polysomnogram recorded the standard physiologic parameters including EEG, EOG, EMG, EKG, nasal and oral airflow.  Respiratory parameters of chest and abdominal movements were recorded with Respiratory Inductance Plethysmography belts.  Oxygen saturation was recorded by pulse oximetry.   Sleep Architecture The total recording time of the polysomnogram was 382.5 minutes.  The total sleep time was 286.5 minutes.  The patient spent 2.6% of total sleep time in Stage N1, 78.7% in Stage N2, 3.1% in Stages N3, and 15.5% in REM.  Sleep latency was 13.5 minutes.  REM latency was 105.0 minutes.  Sleep Efficiency was 74.9%.  Wake after Sleep Onset time was 82.5 minutes.  Respiratory Events The polysomnogram revealed a presence of - obstructive, 2 central, and - mixed apneas resulting in an Apnea index of 0.4 events per hour.  There were 34 hypopneas (>=3% desaturation and/or arousal) resulting in an Apnea\Hypopnea Index (AHI >=3% desaturation and/or arousal) of 7.5 events per hour.  There were 24 hypopneas (>=4% desaturation) resulting in an Apnea\Hypopnea Index (AHI >=4% desaturation) of 5.4 events per hour.  There were - Respiratory Effort Related Arousals resulting in a RERA index of - events per hour. The Respiratory Disturbance Index is 7.5 events per hour.  The snore index was 62.6 events per hour.  Mean oxygen saturation was 94.5%.  The lowest oxygen saturation during sleep was  82.0%.  Time spent <=88% oxygen saturation was 1.8 minutes (0.5%).  Limb Activity There were 7 total limb movements recorded, of this total, - were classified as PLMs.  PLM index was - per hour and PLM associated with Arousals index was - per hour.  Cardiac Summary The average pulse rate was 81.9 bpm.  The minimum pulse rate was 32.0 bpm while the maximum pulse rate was 101.0 bpm.  Cardiac rhythm was normal/abnormal.  Comments: Mild obstructive sleep apnea, AHI (3%) 7.5/hr. Insufficient early events and sleep to meet protocol requirement for split CPAP titration. Snoring with oxygen desaturation to a nadir of 82%, mean 94.5%. See technician comments at end of this report.  Diagnosis: Obstructive sleep apnea  Recommendations: Conservative measures may include observation, weight loss and sleep position off back.  Other measures, including CPAP or a fitted oral appliance, would be based on clinical judgment.   This study was personally reviewed and electronically signed by: REGGY SALT MD Accredited Board Certified in Sleep Medicine Date/Time: 12/16/23  11:58    %%endinterp%%   Diagnostic PSG Report  Patient Name: [name], [name] Date: 12/07/2023  Date of Birth: 1980-07-10 Study Type: Diagnostic  Age: 24 year MRN #: [id number]  Sex: Female Interpreting Physician: SALT REGGY, [id number]  Height: 5' 3 Referring Physician: NELL PIETY [phone])  Weight: 291.0 lbs Recording Tech: Prentice Coombe RPSGT RST  BMI: 51.6 Scoring Tech: Prentice Coombe RPSGT RST  ESS: 16 Neck Size: 15   Study Overview  Lights Off: 10:30:45 PM  Count Index  Lights On: 04:53:13 AM Awakenings: 10 2.1  Time in Bed: 382.5 min. Arousals: 9 1.9  Total Sleep Time: 286.5 min. AHI (>=3% Desat and/or Ar.): 36 7.5   Sleep Efficiency: 74.9% AHI (>=4% Desat): 26 5.4   Sleep Latency: 13.5 min. Limb Movements: 7 1.5  Wake After Sleep Onset: 82.5 min. Snore: 299 62.6  REM Latency from Sleep Onset: 105.0 min.  Desaturations: 39 8.2     Minimum SpO2 TST: 82.0%    Sleep Architecture  % of Time in Bed Stages Time (mins) % Sleep Time  Wake 96.5   Stage N1 7.5 2.6%  Stage N2 225.5 78.7%  Stage N3 9.0 3.1%  REM 44.5 15.5%   Arousal Summary   NREM REM Sleep Index  Respiratory Arousals - - - -  PLM Arousals - - - -  Isolated Limb Movement Arousals - 1 1 0.2  Snore Arousals - - - -  Spontaneous Arousals 6 2 8  1.7  Total 6 3 9  1.9   Limb Movement Summary   Count Index  Isolated Limb Movements 7 1.5  Periodic Limb Movements (PLMs) - -  Total Limb Movements 7 1.5    Respiratory Summary   By Sleep Stage By Body Position Total   NREM REM Supine Non-Supine   Time (min) 242.0 44.5 65.5 221.0 286.5         Obstructive Apnea - - - - -  Mixed Apnea - - - - -  Central Apnea - 2 - 2 2  Total Apneas - 2 - 2 2  Total Apnea Index - 2.7 - 0.5 0.4         Hypopneas (>=3% Desat and/or Ar.) 14 20 19 15  34  AHI (>=3% Desat and/or Ar.) 3.5 29.7 17.4 4.6 7.5         Hypopneas (>=4% Desat) 12 12 14 10 24   AHI (>=4% Desat) 3.0 18.9 12.8 3.3 5.4          RERAs - - - - -  RERA Index - - - - -         RDI 3.5 29.7 17.4 4.6 7.5    Respiratory Event Type Index  Central Apneas 0.4  Obstructive Apneas -  Mixed Apneas -  Central Hypopneas -  Obstructive Hypopneas 7.3  Central Apnea + Hypopnea (CAHI) 0.4  Obstructive Apnea + Hypopnea (OAHI) 7.3   Respiratory Event Durations   Apnea Hypopnea   NREM REM NREM REM  Average (seconds) - 14.5 30.7 34.4  Maximum (seconds) - 14.7 41.8 50.3    Oxygen Saturation Summary   Wake NREM REM TST TIB  Average SpO2 (%) 96.1% 94.2% 93.4% 94.0% 94.5%  Minimum SpO2 (%) 80.0% 83.0% 82.0% 82.0% 80.0%  Maximum SpO2 (%) 100.0% 99.0% 98.0% 99.0% 100.0%   Oxygen Saturation Distribution  Range (%) Time in range (min) Time in range (%)  90.0 - 100.0 339.9 98.1%  80.0 - 90.0 6.5 1.9%  70.0 - 80.0 0.1 0.0%  60.0 - 70.0 - -  50.0 - 60.0 - -  0.0 - 50.0 - -   Time Spent <=88% SpO2  Range (%) Time in range (min) Time in range (%)  0.0 - 88.0 1.8 0.5%      Count Index  Desaturations 39 8.2    Cardiac Summary   Wake NREM REM Sleep Total  Average Pulse Rate (BPM) 83.8 81.7 80.1 81.4 81.9  Minimum Pulse Rate (BPM) 32.0 57.0 66.0 57.0 32.0  Maximum Pulse Rate (BPM) 101.0 92.0 99.0 99.0 101.0   Pulse Rate Distribution:  Range (bpm) Time in range (  min) Time in range (%)  0.0 - 40.0 0.0 0.0%  40.0 - 60.0 0.2 0.1%  60.0 - 80.0 96.3 27.6%  80.0 - 100.0 252.6 72.4%  100.0 - 120.0 0.0 0.0%  120.0 - 140.0 - -  140.0 - 200.0 - -      Hypnograms                      Technologist Comments  STUDY TYPE= PSG DATE= 12/10/2023 ROOM # 4 ASSOCIATED DIAGNOSES WAS OSA. SPLIT NIGHT STUDY WAS ORDERED. TECH EXPLAINED THE PROCESS OF HOOK UP AND WIRES. PATIENT EXPRESSED CLEARED UNDERSTANDING. HOOK WAS OKAY.  PATIENT HAD DIFFICULTY FALLING ASLEEP. TECH OBSERVED MILD SLEEP DISORDER BREATHING AS THE STUDY PROGRESSED. SPLIT WAS NOT MET.  STUDY WAS CONTINUED AS DIAGNOSTIC. PATIENT EXHIBIT MORE APNEAS AS THE STUDY ROLLED THROUGH THE NIGHT. PATIENT GOT UP HALF WAY TO THE STUDY REQUESTING FOR FOOD. SNACKS WAS OFFERED. PATIENT USED THE BATHROOM ONE TIME. PATIENT SLEPT LEFT, RIGHT AND SUPINE. NO SIGNIFICANT PLMs. NO MEDICATION WAS TAKING AT THE LAB . MOST EVENTS WERE SEEN IN REM                          Reggy Neysa Bateman, American Board of Sleep Medicine  ELECTRONICALLY SIGNED ON:  12/16/2023, 11:52 AM [facility] PH: [phone]   FX: [phone] ACCREDITED BY THE AMERICAN ACADEMY OF SLEEP MEDICINE

## 2024-01-01 NOTE — Progress Notes (Deleted)
 Office Visit    Patient Name: [name] Date of Encounter: 01/01/2024  Primary Care Provider:  Loretha Richerd SAUNDERS, MD Primary Cardiologist:  Newman JINNY Lawrence, MD  Chief Complaint    Heart Failure Medication Titration - EF 50-55% (by echo 04/2023)  Significant Past Medical History   DM 2                 Allergies  Allergen Reactions   Penicillins Hives and Dermatitis    History of Present Illness    [name] is a 43 y.o. female patient of Dr Lawrence,  Patient lives in group home, comes with caregiver Edwardo?)  Blood Pressure Goal:  130/80  GDMT: ACEI/ARB/ARNI [x] Yes [] No Entresto  24/26 mg bid  Beta blocker [x] Yes [] No Metoprolol  succ 25 mg daily  MRA [x] Yes [] No Spironolactone  25 mg daily  SGLT2 inhibitor [] Yes [x] No   Diuretic  Furosemide  40 mg prn  (d/c bumex 2 mg daily at last visit   Previously tried:    Family Hx:     Social Hx:      Tobacco:  Alcohol:  Caffeine: Diet:      Exercise:   Home BP readings:      Adherence Assessment  Do you ever forget to take your medication? [] Yes [] No  Do you ever skip doses due to side effects? [] Yes [] No  Do you have trouble affording your medicines? [] Yes [] No  Are you ever unable to pick up your medication due to transportation difficulties? [] Yes [] No  Do you ever stop taking your medications because you don't believe they are helping? [] Yes [] No  Do you check your weight daily? [] Yes [] No   Adherence strategy: ***  Barriers to obtaining medications: ***  Accessory Clinical Findings    Lab Results  Component Value Date   CREATININE 0.41 (L) 05/26/2023   BUN 6 05/26/2023   NA 140 05/26/2023   K 5.0 05/26/2023   CL 103 05/26/2023   CO2 21 (L) 05/26/2023   Lab Results  Component Value Date   ALT 16 08/28/2014   AST 21 08/28/2014   ALKPHOS 75 08/28/2014   BILITOT 0.5 08/28/2014   Lab Results  Component Value Date   HGBA1C 6.3 07/24/2005    Home  Medications/Allergies    Current Outpatient Medications  Medication Sig Dispense Refill   albuterol (VENTOLIN HFA) 108 (90 Base) MCG/ACT inhaler Inhale 1-2 puffs into the lungs every 6 (six) hours as needed.     bumetanide (BUMEX) 2 MG tablet Take 2 mg by mouth daily. as directed     clonazePAM (KLONOPIN) 0.5 MG tablet Take 0.5 mg by mouth 2 (two) times daily.     clotrimazole (LOTRIMIN) 1 % cream Apply 1 application topically 2 (two) times daily as needed (for athlete's foot).     docusate sodium (COLACE) 100 MG capsule Take 100 mg by mouth 2 (two) times daily.     empagliflozin (JARDIANCE) 10 MG TABS tablet Take 10 mg by mouth daily.     ENTRESTO  24-26 MG Take 1 tablet by mouth 2 (two) times daily. 60 tablet 2   famotidine (PEPCID) 20 MG tablet Take 20 mg by mouth 2 (two) times daily.     FEROSUL 325 (65 Fe) MG tablet Take 325 mg by mouth daily with breakfast.     fluconazole  (DIFLUCAN ) 150 MG tablet Take 150 mg by mouth once.     furosemide  (LASIX ) 40 MG tablet Take 1 tablet (40 mg total) by  mouth as needed for fluid. 30 tablet 3   hydrOXYzine  (ATARAX /VISTARIL ) 50 MG tablet Take 50 mg by mouth 3 (three) times daily.      lamoTRIgine (LAMICTAL) 100 MG tablet Take 100 mg by mouth daily.     Melatonin 10 MG TABS Take 1 tablet every day by oral route at bedtime for 30 days.     metoprolol  succinate (TOPROL  XL) 25 MG 24 hr tablet Take 1 tablet (25 mg total) by mouth daily. 90 tablet 3   metoprolol  succinate (TOPROL -XL) 100 MG 24 hr tablet Take 100 mg by mouth daily.     naproxen (NAPROSYN) 500 MG tablet Take 500 mg by mouth daily.      nitrofurantoin , macrocrystal-monohydrate, (MACROBID ) 100 MG capsule Take 100 mg by mouth 2 (two) times daily.     omeprazole (PRILOSEC) 20 MG capsule Take 20 mg by mouth 2 (two) times daily.     ondansetron (ZOFRAN-ODT) 4 MG disintegrating tablet Take 4 mg by mouth once.     paliperidone  (INVEGA ) 9 MG 24 hr tablet Take 9 mg by mouth every morning.      phenazopyridine  (PYRIDIUM ) 200 MG tablet Take 1 tablet (200 mg total) by mouth 3 (three) times daily as needed for pain. 6 tablet 0   Semaglutide-Weight Management (WEGOVY) 1 MG/0.5ML SOAJ Inject 1 mg into the skin once a week.     spironolactone  (ALDACTONE ) 25 MG tablet Take 1 tablet (25 mg total) by mouth daily. 90 tablet 3   TRINTELLIX 20 MG TABS tablet Take 20 mg by mouth daily.     No current facility-administered medications for this visit.     Allergies  Allergen Reactions   Penicillins Hives and Dermatitis       Assessment & Plan   No BP recorded.  {Refresh Note OR Click here to enter BP  :1}***  No problem-specific Assessment & Plan notes found for this encounter.   Sean Macwilliams PharmD CPP [facility]  [address] [place], [place] [address] [phone]

## 2024-01-02 ENCOUNTER — Ambulatory Visit
Payer: MEDICAID | Attending: Pharmacist Clinician (PhC)/ Clinical Pharmacy Specialist | Admitting: Pharmacist Clinician (PhC)/ Clinical Pharmacy Specialist

## 2024-01-17 ENCOUNTER — Ambulatory Visit (INDEPENDENT_AMBULATORY_CARE_PROVIDER_SITE_OTHER): Payer: MEDICAID | Admitting: Family Medicine

## 2024-01-17 VITALS — BP 127/81 | HR 78 | Ht 63.0 in | Wt 325.4 lb

## 2024-01-17 DIAGNOSIS — F71 Moderate intellectual disabilities: Secondary | ICD-10-CM

## 2024-01-17 DIAGNOSIS — R7303 Prediabetes: Secondary | ICD-10-CM

## 2024-01-17 DIAGNOSIS — G4733 Obstructive sleep apnea (adult) (pediatric): Secondary | ICD-10-CM

## 2024-01-17 DIAGNOSIS — I10 Essential (primary) hypertension: Secondary | ICD-10-CM | POA: Diagnosis not present

## 2024-01-17 DIAGNOSIS — B372 Candidiasis of skin and nail: Secondary | ICD-10-CM

## 2024-01-17 DIAGNOSIS — F3160 Bipolar disorder, current episode mixed, unspecified: Secondary | ICD-10-CM

## 2024-01-17 DIAGNOSIS — Z7689 Persons encountering health services in other specified circumstances: Secondary | ICD-10-CM

## 2024-01-17 DIAGNOSIS — Z6841 Body Mass Index (BMI) 40.0 and over, adult: Secondary | ICD-10-CM

## 2024-01-17 DIAGNOSIS — E66813 Obesity, class 3: Secondary | ICD-10-CM

## 2024-01-17 MED ORDER — CLOTRIMAZOLE 1 % EX CREA: 1.0000 | TOPICAL_CREAM | Freq: Two times a day (BID) | CUTANEOUS | 1 refills | Status: DC | PRN

## 2024-01-17 NOTE — Progress Notes (Signed)
 New Patient Office Visit  Subjective    Patient ID: [name], female    DOB: 06-Oct-1980  Age: 43 y.o. MRN: [id number]  CC:  Chief Complaint  Patient presents with   Establish Care    Pt reports she is having trouble sleeping. Had sleep study done last month but needs results.     HPI [name] presents with her worker to establish care. Patient reports a rash under her breasts that is intermittent/recurrent and itchy. Patient also reports that she had a recent sleep study and does want a CPAP if she is eligible.    Outpatient Encounter Medications as of 01/17/2024  Medication Sig   albuterol (VENTOLIN HFA) 108 (90 Base) MCG/ACT inhaler Inhale 1-2 puffs into the lungs every 6 (six) hours as needed.   bumetanide (BUMEX) 2 MG tablet Take 2 mg by mouth daily. as directed   clonazePAM (KLONOPIN) 0.5 MG tablet Take 0.5 mg by mouth 2 (two) times daily.   docusate sodium (COLACE) 100 MG capsule Take 100 mg by mouth 2 (two) times daily.   ENTRESTO  24-26 MG Take 1 tablet by mouth 2 (two) times daily.   famotidine (PEPCID) 20 MG tablet Take 20 mg by mouth 2 (two) times daily.   FEROSUL 325 (65 Fe) MG tablet Take 325 mg by mouth daily with breakfast.   fluconazole  (DIFLUCAN ) 150 MG tablet Take 150 mg by mouth once.   furosemide  (LASIX ) 40 MG tablet Take 1 tablet (40 mg total) by mouth as needed for fluid.   hydrOXYzine  (ATARAX /VISTARIL ) 50 MG tablet Take 50 mg by mouth 3 (three) times daily.    Melatonin 10 MG TABS Take 1 tablet every day by oral route at bedtime for 30 days.   metoprolol  succinate (TOPROL  XL) 25 MG 24 hr tablet Take 1 tablet (25 mg total) by mouth daily.   metoprolol  succinate (TOPROL -XL) 100 MG 24 hr tablet Take 100 mg by mouth daily.   omeprazole (PRILOSEC) 20 MG capsule Take 20 mg by mouth 2 (two) times daily.   ondansetron (ZOFRAN-ODT) 4 MG disintegrating tablet Take 4 mg by mouth once.   spironolactone  (ALDACTONE ) 25 MG tablet Take 1 tablet (25 mg total)  by mouth daily.   TRINTELLIX 20 MG TABS tablet Take 20 mg by mouth daily.   clotrimazole (LOTRIMIN) 1 % cream Apply 1 Application topically 2 (two) times daily as needed (for athlete's foot).   empagliflozin (JARDIANCE) 10 MG TABS tablet Take 10 mg by mouth daily. (Patient not taking: Reported on 01/17/2024)   lamoTRIgine (LAMICTAL) 100 MG tablet Take 100 mg by mouth daily. (Patient not taking: Reported on 01/17/2024)   naproxen (NAPROSYN) 500 MG tablet Take 500 mg by mouth daily.    nitrofurantoin , macrocrystal-monohydrate, (MACROBID ) 100 MG capsule Take 100 mg by mouth 2 (two) times daily.   paliperidone  (INVEGA ) 9 MG 24 hr tablet Take 9 mg by mouth every morning. (Patient not taking: Reported on 01/17/2024)   phenazopyridine  (PYRIDIUM ) 200 MG tablet Take 1 tablet (200 mg total) by mouth 3 (three) times daily as needed for pain. (Patient not taking: Reported on 01/17/2024)   Semaglutide-Weight Management (WEGOVY) 1 MG/0.5ML SOAJ Inject 1 mg into the skin once a week. (Patient not taking: Reported on 01/17/2024)   [DISCONTINUED] clotrimazole (LOTRIMIN) 1 % cream Apply 1 application topically 2 (two) times daily as needed (for athlete's foot). (Patient not taking: Reported on 01/17/2024)   No facility-administered encounter medications on file as of 01/17/2024.  Past Medical History:  Diagnosis Date   Bipolar 1 disorder (HCC)    Diabetes mellitus without complication (HCC)    H/O tracheostomy    Hx of emotional problems    Mental retardation    Obesity    Schizophrenia (HCC)     Past Surgical History:  Procedure Laterality Date   TONSILLECTOMY      Family History  Problem Relation Age of Onset   BRCA 1/2 Neg Hx    Breast cancer Neg Hx     Social History   Socioeconomic History   Marital status: Single    Spouse name: Not on file   Number of children: Not on file   Years of education: Not on file   Highest education level: Not on file  Occupational History   Not on file   Tobacco Use   Smoking status: Former    Current packs/day: 0.00    Types: Cigarettes    Quit date: 08/07/2005    Years since quitting: 18.4   Smokeless tobacco: Never  Substance and Sexual Activity   Alcohol use: No   Drug use: No   Sexual activity: Yes    Birth control/protection: None  Other Topics Concern   Not on file  Social History Narrative   Not on file   Social Drivers of Health   Financial Resource Strain: Not on file  Food Insecurity: No Food Insecurity (01/17/2024)   Hunger Vital Sign    Worried About Running Out of Food in the Last Year: Never true    Ran Out of Food in the Last Year: Never true  Transportation Needs: No Transportation Needs (01/17/2024)   PRAPARE - Administrator, Civil Service (Medical): No    Lack of Transportation (Non-Medical): No  Physical Activity: Not on file  Stress: Not on file  Social Connections: Not on file  Intimate Partner Violence: Not At Risk (01/17/2024)   Humiliation, Afraid, Rape, and Kick questionnaire    Fear of Current or Ex-Partner: No    Emotionally Abused: No    Physically Abused: No    Sexually Abused: No    Review of Systems  All other systems reviewed and are negative.       Objective   BP 127/81   Pulse 78   Ht 5' 3 (1.6 m)   Wt (!) 325 lb 6.4 oz (147.6 kg)   SpO2 98%   BMI 57.64 kg/m   Physical Exam Vitals and nursing note reviewed.  Constitutional:      General: She is not in acute distress.    Appearance: She is obese.  Cardiovascular:     Rate and Rhythm: Normal rate and regular rhythm.  Pulmonary:     Effort: Pulmonary effort is normal.     Breath sounds: Normal breath sounds.  Abdominal:     Palpations: Abdomen is soft.     Tenderness: There is no abdominal tenderness.  Skin:    Findings: Rash present.  Neurological:     General: No focal deficit present.     Mental Status: She is alert and oriented to person, place, and time.         Assessment & Plan:  1.  Intertriginous candidiasis (Primary) Ski care discussed. Lotrimin prescribed.   2. Obstructive sleep apnea syndrome Sleep study reviewed.  - For home use only DME continuous positive airway pressure (CPAP)  3. Essential hypertension Appears stable. Continue   4. Prediabetes Continue   5. MENTAL RETARDATION,  MODERATE   6. Mixed bipolar I disorder (HCC)  7. Class 3 severe obesity due to excess calories with serious comorbidity and body mass index (BMI) of 50.0 to 59.9 in adult (HCC)   8. Encounter to establish care     Return in about 2 months (around 03/18/2024) for follow up.   Tanda Raguel SQUIBB, MD

## 2024-01-18 ENCOUNTER — Encounter: Payer: Self-pay | Admitting: Family Medicine

## 2024-01-20 ENCOUNTER — Ambulatory Visit (HOSPITAL_COMMUNITY)
Admission: EM | Admit: 2024-01-20 | Discharge: 2024-01-20 | Disposition: A | Discharge status: Home / Self Care | Payer: MEDICAID

## 2024-01-20 DIAGNOSIS — F3162 Bipolar disorder, current episode mixed, moderate: Secondary | ICD-10-CM

## 2024-01-20 DIAGNOSIS — F79 Unspecified intellectual disabilities: Secondary | ICD-10-CM
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
## 2024-01-20 NOTE — ED Provider Notes (Incomplete)
 Behavioral Health Urgent Care Medical Screening Exam  Patient Name: [name] MRN: [id number] Date of Evaluation: 01/20/24 Chief Complaint:   Diagnosis:  Final diagnoses:  Intellectual disability  Bipolar 1 disorder, mixed, moderate (HCC)    History of Present illness: [name] is a 43 y.o. female patient with a history of Intellectual  disability presented to [facility] as a walk in accompanied by her guardian [name] with complaints of patient left the home last night without permission from the guardian. Catherine reports that patient has been chatting with random people online. Patient states that she left the home to go be with her boyfriend. Patient called her guardian's sister and asked her to come pick her up.   [name], 43 y.o., female patient seen face to face by this provider, consulted with Dr. ***; and chart reviewed on 01/20/24.  On evaluation [name] reports  During evaluation [name] is ***(position) in no acute distress.  ***He/She is alert, oriented x 4, calm, cooperative and attentive.  ***His/Her mood is ***euthymic with congruent affect.  ***He/She has normal speech, and behavior.  Objectively there is no evidence of psychosis/mania or delusional thinking.  Patient is able to converse coherently, goal directed thoughts, no distractibility, or pre-occupation.  ***He/She also denies suicidal/self-harm/homicidal ideation, psychosis, and paranoia.  Patient answered question appropriately.                                             Flowsheet Row ED from 01/20/2024 in [facility] UC from 09/06/2023 in [facility] Urgent Care at [facility]) ED from 05/26/2023 in [facility] Emergency Department at [facility]  C-SSRS RISK CATEGORY Moderate Risk No Risk No Risk    Psychiatric Specialty Exam  Presentation  General Appearance:Casual  Eye Contact:Fair  Speech:Clear and Coherent  Speech  Volume:Normal  Handedness:Right   Mood and Affect  Mood: Euthymic  Affect: Appropriate   Thought Process  Thought Processes: Coherent  Descriptions of Associations:Intact  Orientation:Full (Time, Place and Person)  Thought Content:WDL    Hallucinations:None  Ideas of Reference:None  Suicidal Thoughts:No  Homicidal Thoughts:No   Sensorium  Memory: Immediate Fair; Recent Fair; Remote Fair  Judgment: Poor  Insight: Poor   Executive Functions  Concentration: Fair  Attention Span: Fair  Recall: Fair  Fund of Knowledge: Fair  Language: Fair   Psychomotor Activity  Psychomotor Activity: Normal   Assets  Assets: Housing; Physical Health; Resilience   Sleep  Sleep: Fair  Number of hours: No data recorded  Physical Exam: Physical Exam Review of Systems  Constitutional: Negative.   HENT: Negative.    Eyes: Negative.   Respiratory: Negative.    Cardiovascular: Negative.   Gastrointestinal: Negative.   Genitourinary: Negative.   Musculoskeletal: Negative.   Skin: Negative.   Neurological: Negative.   Psychiatric/Behavioral:  The patient is nervous/anxious.    Blood pressure (!) 142/71, pulse 83, resp. rate 16, SpO2 98%. There is no height or weight on file to calculate BMI.  Musculoskeletal: Strength & Muscle Tone: within normal limits Gait & Station: normal Patient leans: N/A   BHUC MSE Discharge Disposition for Follow up and Recommendations: Based on my evaluation the patient does not appear to have an emergency medical condition and can be discharged with resources and follow up care in outpatient services for  Medication Management and Individual Therapy   Catherine FORBES Olp, NP 01/20/2024, 9:57 PM

## 2024-01-20 NOTE — ED Provider Notes (Signed)
 Behavioral Health Urgent Care Medical Screening Exam  Patient Name: ASYIA HORNUNG MRN: [id number] Date of Evaluation: 01/20/24 Chief Complaint:  left [facility] home Diagnosis:  Final diagnoses:  Intellectual disability  Bipolar 1 disorder, mixed, moderate (HCC)    History of Present illness: Cayle D Bohanon is a 43 y.o. female patient with a history of Intellectual  disability presented to [facility] as a walk in accompanied by her guardian [name] with complaints of patient left the home last night without permission from the guardian. Rosina reports that patient has been chatting with random people online. Patient states that she left the home to go be with her boyfriend.  A silver alert was issued for patient.  Patient called her guardian's sister and asked her to come pick her up. Patient's guardian [name] brought patient into be evaluated.  Patient's guardian states that patient has a long history of being in group homes and ALF.  Patient has been living with her guardian [name] for a little over a year.  [name] reports the patient has been compliant with the rules of the home up until a few months ago when patient got a cell phone and every since then patient has been talking to a lot of random men on social media.  Amesha JONETTA Leaven, 43 y.o., female patient seen face to face by this provider chart reviewed on 01/20/24.  On evaluation Ciji D Wank reports that she did leave the ALF to go spend time with her boyfriend.  Patient denies any SI/HI or AVH.  Patient denies any substance use.  Patient receives medication management from the [facility] in [place]. Patient states that she is compliant with her medications and patient's guardian concurs with patient. Patient's guardian is frustrated with patient's behavior of trying to meet up with random strangers and telling random strangers when she lives.   During evaluation Carlisia D Mummert is sitting in the assessment room in no  acute distress.  She is alert, oriented x 4, calm, cooperative, pleasant and attentive.  Her mood is euthymic with congruent affect.  She has normal speech, and behavior.  Objectively there is no evidence of psychosis/mania or delusional thinking.  Patient is able to converse coherently, goal directed thoughts, no distractibility, or pre-occupation. She also denies suicidal/self-harm/homicidal ideation, psychosis, and paranoia.  Patient answered question appropriately.                                            Patient is able to contract for safety and can be discharged with resources and follow up care in outpatient services for Medication Management and Individual Therapy with the [facility].  Flowsheet Row ED from 01/20/2024 in [facility] UC from 09/06/2023 in [facility]gent Care at [facility]) ED from 05/26/2023 in [facility] Emergency Department at [facility]  C-SSRS RISK CATEGORY Moderate Risk No Risk No Risk    Psychiatric Specialty Exam  Presentation  General Appearance:Casual  Eye Contact:Fair  Speech:Clear and Coherent  Speech Volume:Normal  Handedness:Right   Mood and Affect  Mood: Euthymic  Affect: Appropriate   Thought Process  Thought Processes: Coherent  Descriptions of Associations:Intact  Orientation:Full (Time, Place and Person)  Thought Content:WDL    Hallucinations:None  Ideas of Reference:None  Suicidal Thoughts:No  Homicidal Thoughts:No   Sensorium  Memory: Immediate Fair; Recent  Fair; Remote Fair  Judgment: Poor  Insight: Poor   Chartered certified accountant: Fair  Attention Span: Fair  Recall: [employer] of Knowledge: Fair  Language: Fair   Psychomotor Activity  Psychomotor Activity: Normal   Assets  Assets: Housing; Physical Health; Resilience   Sleep  Sleep: Fair  Number of hours: No data recorded  Physical Exam: Physical Exam HENT:      Head: Normocephalic.     Nose: Nose normal.  Eyes:     Pupils: Pupils are equal, round, and reactive to light.  Pulmonary:     Effort: Pulmonary effort is normal.  Abdominal:     General: Abdomen is flat.  Musculoskeletal:        General: Normal range of motion.     Cervical back: Normal range of motion.  Skin:    General: Skin is warm.  Neurological:     Mental Status: She is alert and oriented to person, place, and time.  Psychiatric:        Attention and Perception: Attention normal.        Mood and Affect: Mood normal.        Speech: Speech normal.        Behavior: Behavior is cooperative.        Thought Content: Thought content is not paranoid or delusional. Thought content does not include homicidal or suicidal ideation. Thought content does not include homicidal or suicidal plan.        Cognition and Memory: Cognition normal.        Judgment: Judgment is impulsive.    Review of Systems  Constitutional: Negative.   HENT: Negative.    Eyes: Negative.   Respiratory: Negative.    Cardiovascular: Negative.   Gastrointestinal: Negative.   Genitourinary: Negative.   Musculoskeletal: Negative.   Skin: Negative.   Neurological: Negative.   Psychiatric/Behavioral:  The patient is nervous/anxious.    Blood pressure (!) 142/71, pulse 83, resp. rate 16, SpO2 98%. There is no height or weight on file to calculate BMI.  Musculoskeletal: Strength & Muscle Tone: within normal limits Gait & Station: normal Patient leans: N/A   [facility] MSE Discharge Disposition for Follow up and Recommendations: Based on my evaluation the patient does not appear to have an emergency medical condition and can be discharged with resources and follow up care in outpatient services for Medication Management and Individual Therapy   Roxianne FORBES Olp, NP 01/20/2024, 9:57 PM
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
## 2024-01-20 NOTE — Discharge Instructions (Signed)

## 2024-01-23 ENCOUNTER — Other Ambulatory Visit: Payer: Self-pay

## 2024-01-23 ENCOUNTER — Other Ambulatory Visit (HOSPITAL_COMMUNITY): Payer: Self-pay

## 2024-01-23 ENCOUNTER — Encounter (HOSPITAL_COMMUNITY): Payer: Self-pay

## 2024-01-23 ENCOUNTER — Emergency Department (HOSPITAL_COMMUNITY)
Admission: EM | Admit: 2024-01-23 | Discharge: 2024-01-24 | Disposition: A | Discharge status: Home / Self Care | Payer: MEDICAID | Attending: Emergency Medicine | Admitting: Emergency Medicine

## 2024-01-23 DIAGNOSIS — F1410 Cocaine abuse, uncomplicated: Secondary | ICD-10-CM | POA: Diagnosis not present

## 2024-01-23 DIAGNOSIS — F79 Unspecified intellectual disabilities: Secondary | ICD-10-CM

## 2024-01-23 DIAGNOSIS — F39 Unspecified mood [affective] disorder: Secondary | ICD-10-CM

## 2024-01-23 DIAGNOSIS — Z008 Encounter for other general examination: Secondary | ICD-10-CM

## 2024-01-23 LAB — CBC WITH DIFFERENTIAL/PLATELET
Abs Immature Granulocytes: 0.02 K/uL (ref 0.00–0.07)
Basophils Absolute: 0 K/uL (ref 0.0–0.1)
Basophils Relative: 0 %
Eosinophils Absolute: 0.1 K/uL (ref 0.0–0.5)
Eosinophils Relative: 1 %
HCT: 38.1 % (ref 36.0–46.0)
Hemoglobin: 12.7 g/dL (ref 12.0–15.0)
Immature Granulocytes: 0 %
Lymphocytes Relative: 36 %
Lymphs Abs: 4.2 K/uL — ABNORMAL HIGH (ref 0.7–4.0)
MCH: 24.8 pg — ABNORMAL LOW (ref 26.0–34.0)
MCHC: 33.3 g/dL (ref 30.0–36.0)
MCV: 74.4 fL — ABNORMAL LOW (ref 80.0–100.0)
Monocytes Absolute: 0.9 K/uL (ref 0.1–1.0)
Monocytes Relative: 8 %
Neutro Abs: 6.2 K/uL (ref 1.7–7.7)
Neutrophils Relative %: 55 %
Platelets: 286 K/uL (ref 150–400)
RBC: 5.12 MIL/uL — ABNORMAL HIGH (ref 3.87–5.11)
RDW: 14.3 % (ref 11.5–15.5)
WBC: 11.4 K/uL — ABNORMAL HIGH (ref 4.0–10.5)
nRBC: 0 % (ref 0.0–0.2)

## 2024-01-23 LAB — COMPREHENSIVE METABOLIC PANEL WITH GFR
ALT: 13 U/L (ref 0–44)
AST: 16 U/L (ref 15–41)
Albumin: 3.7 g/dL (ref 3.5–5.0)
Alkaline Phosphatase: 73 U/L (ref 38–126)
Anion gap: 11 (ref 5–15)
BUN: 7 mg/dL (ref 6–20)
CO2: 25 mmol/L (ref 22–32)
Calcium: 8.5 mg/dL — ABNORMAL LOW (ref 8.9–10.3)
Chloride: 100 mmol/L (ref 98–111)
Creatinine, Ser: 0.57 mg/dL (ref 0.44–1.00)
GFR, Estimated: >60 mL/min (ref >60)
Glucose, Bld: 75 mg/dL (ref 70–99)
Potassium: 3.6 mmol/L (ref 3.5–5.1)
Sodium: 136 mmol/L (ref 135–145)
Total Bilirubin: 0.7 mg/dL (ref 0.0–1.2)
Total Protein: 7.7 g/dL (ref 6.5–8.1)

## 2024-01-23 LAB — RAPID URINE DRUG SCREEN, HOSP PERFORMED
Amphetamines: NOT DETECTED
Barbiturates: NOT DETECTED
Benzodiazepines: NOT DETECTED
Cocaine: POSITIVE — AB
Opiates: NOT DETECTED
Tetrahydrocannabinol: NOT DETECTED

## 2024-01-23 LAB — ACETAMINOPHEN LEVEL: Acetaminophen (Tylenol), Serum: <10 ug/mL — ABNORMAL LOW (ref 10–30)

## 2024-01-23 LAB — SALICYLATE LEVEL: Salicylate Lvl: <7 mg/dL — ABNORMAL LOW (ref 7.0–30.0)

## 2024-01-23 LAB — ETHANOL: Alcohol, Ethyl (B): <15 mg/dL (ref <15)

## 2024-01-23 NOTE — BH Assessment (Signed)
 Patient was deferred to IRIS for a telepsych assessment. The assigned care coordinator will provide updates regarding the scheduling of the assessment. IRIS coordinator can be reached at [phone] for further information on the timing of the telepsych evaluation.

## 2024-01-23 NOTE — ED Notes (Signed)
 Pt dressed out into purple scrubs. Pt belongings placed in purple locker #3. Pt's file given to purple RN.

## 2024-01-23 NOTE — ED Provider Notes (Signed)
 [place] EMERGENCY DEPARTMENT AT [facility] Provider Note   CSN: [id number] Arrival date & time: 01/23/24  8047     Patient presents with: Psychiatric Evaluation   [name] is a 43 y.o. female.   Pt complains of feeling agitated and frustrated with her caregiver who she states is not caring for her adequately.  She states this is not her legal guardian and her legal guardian is out to have a baby soon.  She states she would like to change her living situations.  Her caregiver stated that she had mentioned suicidal ideations earlier but currently she denies this with us .  She is agreeable to stay for psychiatric evaluation  The history is provided by the patient. No language interpreter was used.       Prior to Admission medications   Medication Sig Start Date End Date Taking? Authorizing Provider  albuterol (VENTOLIN HFA) 108 (90 Base) MCG/ACT inhaler Inhale 1-2 puffs into the lungs every 6 (six) hours as needed. 10/02/23   [provider]  bumetanide (BUMEX) 2 MG tablet Take 2 mg by mouth daily. as directed 07/23/23   [provider]  clonazePAM (KLONOPIN) 0.5 MG tablet Take 0.5 mg by mouth 2 (two) times daily. 12/22/22   [provider]  clotrimazole (LOTRIMIN) 1 % cream Apply 1 Application topically 2 (two) times daily as needed (for athlete's foot). 01/17/24   Tanda Bleacher, MD  docusate sodium (COLACE) 100 MG capsule Take 100 mg by mouth 2 (two) times daily.    [provider]  empagliflozin (JARDIANCE) 10 MG TABS tablet Take 10 mg by mouth daily. Patient not taking: Reported on 01/17/2024 05/15/23   [provider]  ENTRESTO  24-26 MG Take 1 tablet by mouth 2 (two) times daily. 10/10/23   Patwardhan, Newman PARAS, MD  famotidine (PEPCID) 20 MG tablet Take 20 mg by mouth 2 (two) times daily. 12/22/22   [provider]  FEROSUL 325 (65 Fe) MG tablet Take 325 mg by mouth daily with breakfast. 01/09/23   [provider]  fluconazole  (DIFLUCAN ) 150 MG tablet Take 150 mg by mouth once. 10/02/23   [provider]  furosemide  (LASIX ) 40 MG tablet Take 1 tablet (40 mg total) by mouth as needed for fluid. 10/10/23   Patwardhan, Newman PARAS, MD  hydrOXYzine  (ATARAX /VISTARIL ) 50 MG tablet Take 50 mg by mouth 3 (three) times daily.     [provider]  lamoTRIgine (LAMICTAL) 100 MG tablet Take 100 mg by mouth daily. Patient not taking: Reported on 01/17/2024 12/22/22   [provider]  Melatonin 10 MG TABS Take 1 tablet every day by oral route at bedtime for 30 days. 01/03/23   [provider]  metoprolol  succinate (TOPROL  XL) 25 MG 24 hr tablet Take 1 tablet (25 mg total) by mouth daily. 10/10/23   Patwardhan, Newman PARAS, MD  metoprolol  succinate (TOPROL -XL) 100 MG 24 hr tablet Take 100 mg by mouth daily. 10/02/23   [provider]  naproxen (NAPROSYN) 500 MG tablet Take 500 mg by mouth daily.     [provider]  nitrofurantoin , macrocrystal-monohydrate, (MACROBID ) 100 MG capsule Take 100 mg by mouth 2 (two) times daily. 08/31/23   [provider]  omeprazole (PRILOSEC) 20 MG capsule Take 20 mg by mouth 2 (two) times daily.    [provider]  ondansetron (ZOFRAN-ODT) 4 MG disintegrating tablet Take 4 mg by mouth once. 10/02/23   [provider]  paliperidone  (INVEGA )  9 MG 24 hr tablet Take 9 mg by mouth every morning. Patient not taking: Reported on 01/17/2024    [provider]  phenazopyridine  (PYRIDIUM ) 200 MG tablet Take 1 tablet (200 mg total) by mouth 3 (three) times daily as needed for pain. Patient not taking: Reported on 01/17/2024 04/24/12   Harris, Abigail, PA-C  Semaglutide-Weight Management (WEGOVY) 1 MG/0.5ML SOAJ Inject 1 mg into the skin once a week. Patient not taking: Reported on 01/17/2024 01/08/23   [provider]  spironolactone  (ALDACTONE ) 25 MG tablet Take 1 tablet (25 mg total) by mouth daily. 10/10/23    Patwardhan, Manish J, MD  TRINTELLIX 20 MG TABS tablet Take 20 mg by mouth daily. 12/22/22   [provider]    Allergies: Penicillins    Review of Systems  Constitutional:  Negative for chills and fever.  Respiratory:  Negative for shortness of breath.   Cardiovascular:  Negative for chest pain.  Psychiatric/Behavioral:  Positive for agitation. Negative for suicidal ideas. The patient is nervous/anxious.   All other systems reviewed and are negative.   Updated Vital Signs BP (!) 154/117   Pulse (!) 110   Temp 98.5 F (36.9 C) (Oral)   Resp 20   Ht 5' 3 (1.6 m)   Wt (!) 147.6 kg   BMI 57.64 kg/m   Physical Exam Vitals and nursing note reviewed.  Constitutional:      General: She is not in acute distress.    Appearance: Normal appearance. She is not ill-appearing.  HENT:     Head: Normocephalic and atraumatic.     Nose: Nose normal.  Eyes:     Conjunctiva/sclera: Conjunctivae normal.  Cardiovascular:     Rate and Rhythm: Regular rhythm. Tachycardia present.  Pulmonary:     Effort: Pulmonary effort is normal. No respiratory distress.  Musculoskeletal:        General: No deformity. Normal range of motion.     Cervical back: Normal range of motion.  Skin:    Findings: No rash.  Neurological:     Mental Status: She is alert.  Psychiatric:        Mood and Affect: Affect is tearful.        Speech: Speech normal.        Thought Content: Thought content does not include homicidal or suicidal ideation. Thought content does not include homicidal or suicidal plan.     (all labs ordered are listed, but only abnormal results are displayed) Labs Reviewed  ACETAMINOPHEN  LEVEL - Abnormal; Notable for the following components:      Result Value   Acetaminophen  (Tylenol ), Serum <10 (*)    All other components within normal limits  CBC WITH DIFFERENTIAL/PLATELET - Abnormal; Notable for the following components:   WBC 11.4 (*)    RBC 5.12 (*)    MCV 74.4 (*)    MCH  24.8 (*)    Lymphs Abs 4.2 (*)    All other components within normal limits  COMPREHENSIVE METABOLIC PANEL WITH GFR - Abnormal; Notable for the following components:   Calcium 8.5 (*)    All other components within normal limits  RAPID URINE DRUG SCREEN, HOSP PERFORMED - Abnormal; Notable for the following components:   Cocaine POSITIVE (*)    All other components within normal limits  SALICYLATE LEVEL - Abnormal; Notable for the following components:   Salicylate Lvl <7.0 (*)    All other components within normal limits  ETHANOL    EKG: None  Radiology: No results found.   Procedures   Medications Ordered in the ED - No data to display                                  Medical Decision Making Amount and/or Complexity of Data Reviewed Labs: ordered.   43 year old female presents today for concern feeling frustrated with her living situation.  She states she would like to change her living situation.  Does not feel comfortable with her caregiver.  Denies SI, HI.  Would like to talk to the behavioral health team.  She is agreeable to stay.  No medical complaints.  CBC with mild leukocytosis.  No anemia.  CMP without acute concern.  Salicylate, acetaminophen , alcohol level within normal.  UDS positive for cocaine.  EKG did not crossover into the system but shows normal sinus rhythm.  I have attached an image of this under the media tab.  Patient medically cleared for psychiatric evaluation.  Final diagnoses:  Patient needs psychiatric hold for evaluation    ED Discharge Orders     None          Hildegard Loge, DEVONNA 01/23/24 2145    Towana Ozell BROCKS, MD 01/23/24 2248

## 2024-01-23 NOTE — ED Provider Triage Note (Signed)
 Emergency Medicine Provider Triage Evaluation Note  [name] , a 43 y.o. female  was evaluated in triage.  Pt complains of feeling agitated and frustrated with her caregiver who she states is not caring for her adequately.  She states this is not her legal guardian and her legal guardian is out to have a baby soon.  She states she would like to change her living situations.  Her caregiver stated that she had mentioned suicidal ideations earlier but currently she denies this with us .  She is agreeable to stay for psychiatric evaluation  Review of Systems  Positive: As above Negative: As above  Physical Exam  BP (!) 154/117   Pulse (!) 110   Temp 98.5 F (36.9 C) (Oral)   Resp 20   Ht 5' 3 (1.6 m)   Wt (!) 147.6 kg   BMI 57.64 kg/m  Gen:   Awake, no distress   Resp:  Normal effort  MSK:   Moves extremities without difficulty  Other:    Medical Decision Making  Medically screening exam initiated at 8:27 PM.  Appropriate orders placed.  Arrion D Neale was informed that the remainder of the evaluation will be completed by another provider, this initial triage assessment does not replace that evaluation, and the importance of remaining in the ED until their evaluation is complete.    Hildegard Loge, PA-C 01/23/24 2028

## 2024-01-23 NOTE — ED Notes (Signed)
 Pt given soda and a sandwich bag

## 2024-01-23 NOTE — ED Triage Notes (Signed)
 Pt brought in by legal guardian after reports of her running away recently. Guardian reports patient stating she's having Suicidal thoughts. Pt agitated in triage but denies any SI/HI.
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
## 2024-01-24 ENCOUNTER — Emergency Department (HOSPITAL_COMMUNITY): Payer: MEDICAID

## 2024-01-24 ENCOUNTER — Encounter (HOSPITAL_COMMUNITY): Payer: Self-pay | Admitting: Psychiatric/Mental Health

## 2024-01-24 ENCOUNTER — Other Ambulatory Visit (HOSPITAL_COMMUNITY): Payer: Self-pay

## 2024-01-24 ENCOUNTER — Other Ambulatory Visit: Payer: Self-pay

## 2024-01-24 DIAGNOSIS — F79 Unspecified intellectual disabilities: Secondary | ICD-10-CM

## 2024-01-24 DIAGNOSIS — F39 Unspecified mood [affective] disorder: Secondary | ICD-10-CM

## 2024-01-24 LAB — TROPONIN I (HIGH SENSITIVITY): Troponin I (High Sensitivity): 3 ng/L (ref <18)

## 2024-01-24 NOTE — ED Notes (Addendum)
 Paper work given to legal guardian. Pt is leaving with legal guardian in no new onset distress. Belongings reviewed with pt and guardian and returned.

## 2024-01-24 NOTE — Discharge Instructions (Signed)
 Please do not take any illegal drugs. Please follow-up with behavioral health providers

## 2024-01-24 NOTE — ED Notes (Signed)
 Pt having TTS interview at this time

## 2024-01-24 NOTE — ED Provider Notes (Signed)
 43 year old female intellectual delay, with guardian, presented to ED with report that she had left the home with a guardian without permission.  There is a triage note that states that the guardian stated the patient was having SI HI.  Physical Exam  BP 134/74 (BP Location: Left Arm)   Pulse 85   Temp 98.5 F (36.9 C) (Oral)   Resp 18   Ht 1.6 m (5' 3)   Wt (!) 147.6 kg   SpO2 99%   BMI 57.64 kg/m   Physical Exam No acute distress Blood pressure normal Heart rate normal Normal oxygen saturations and respiratory rate Patient resting comfortably Procedures  Procedures  ED Course / MDM    Medical Decision Making Amount and/or Complexity of Data Reviewed Labs: ordered. Radiology: ordered. ECG/medicine tests: ordered.   This is a 43 year old IDD patient who initially presented with reports that she was leaving the home where she stays with her guardian.  There were also concerns regarding SI and HI. Patient was evaluated here in the ED.  Labs have been reviewed.  Patient's urine drug screen was positive for cocaine use. Psychiatry has seen and cleared patient and states that she has no SI or HI. Plan contact with guardian and likely discharge Per RN and psych, guardian is coming to pick up       Levander Houston, MD 01/24/24 [phone]

## 2024-01-24 NOTE — ED Notes (Signed)
 Patient reported to writer that she's been having left sided CP/tightness for 3 weeks. Denies any worsening or changes in symptoms in that time. NAD noted on assessment. Writer notified Wickline, MD who ordered a troponin level and a chest xray.

## 2024-01-24 NOTE — ED Provider Notes (Signed)
 Patient is awaiting placement, reports feeling frustrated about her living situation.  After being monitored, she mentioned to nursing that she has had chest pain for a while Patient does have a history of heart failure. X-ray was performed and reviewed which shows cardiomegaly but no other acute findings.  EKG is unremarkable and unchanged.  Troponin negative.  Patient does have a history of cocaine use disorder  Patient resting comfortably.  Will defer further workup at this time, awaiting placement  ED ECG REPORT   Date: 01/23/2024  Rate: 88  Rhythm: normal sinus rhythm  QRS Axis: normal  Intervals: normal  ST/T Wave abnormalities: normal  Conduction Disutrbances:none  Narrative Interpretation:   Old EKG Reviewed: unchanged  I have personally reviewed the EKG tracing and agree with the computerized printout as noted.      Midge Golas, MD 01/24/24 [phone]

## 2024-01-24 NOTE — Consult Note (Signed)
 [name] Telepsychiatry Consult Note  Patient Name: [name] MRN: [id number] DOB: 03-01-1981 DATE OF Consult: 01/24/2024  PRIMARY PSYCHIATRIC DIAGNOSES  1.  Intellectual Disability  2.  Mood Disorder, Unspecified    RECOMMENDATIONS  Recommendations: Medication recommendations: Primary team to complete medication reconciliation for accuracy. No changes indicated at this time.    Non-Medication/therapeutic recommendations:  -- Attempted to call caregiver early this morning for collateral information but no answer -- Patient does not meet criteria for IVC nor inpatient psychiatric admission -- Patient deemed appropriate for discharge -- Consider SW/ CM consultation to assist with disposition -- ED return precautions   Is inpatient psychiatric hospitalization recommended for this patient? No (Explain why): No, patient is not an imminent danger to herself or others at this time  From a psychiatric perspective, is this patient appropriate for discharge to an outpatient setting/resource or other less restrictive environment for continued care?  Yes (Explain why): SW/ Case Management to evaluate for placement needs/ return to group home. Attempted to call caregiver for collateral but no answer.  Follow-Up Telepsychiatry C/L services: We will sign off for now. Please re-consult our service if needed for any concerning changes in the patient's condition, discharge planning, or questions.  Communication: Treatment team members (and family members if applicable) who were involved in treatment/care discussions and planning, and with whom we spoke or engaged with via secure text/chat, include the following: ED Team via [employer]  Thank you for involving us  in the care of this patient. If you have any additional questions or concerns, please call [phone] and ask for me or the provider on-call.  TELEPSYCHIATRY ATTESTATION & CONSENT  As the provider for this telehealth consult, I attest that I  verified the patient's identity using two separate identifiers, introduced myself to the patient, provided my credentials, disclosed my location, and performed this encounter via a HIPAA-compliant, real-time, face-to-face, two-way, interactive audio and video platform and with the full consent and agreement of the patient (or guardian as applicable.)  Patient physical location: ED in [facility]. Telehealth provider physical location: home office in state of [place] .  Video start time: 0356 (Central Time) Video end time: 0416 (Central Time)  IDENTIFYING DATA  [name] is a 43 y.o. year-old female for whom a psychiatric consultation has been ordered by the primary provider. The patient was identified using two separate identifiers.  CHIEF COMPLAINT/REASON FOR CONSULT  Very Anxious, SI with caregiver earlier but denies it now. Agitated earlier with staff.    HISTORY OF PRESENT ILLNESS (HPI)  The patient is a 43yo female with prior documented history of Intellectual Disability, Bipolar Disorder, Depression, Anxiety, who presented to the emergency department with legal guardian after recent reports of running away and verbalizing suicidal ideations.   Upon evaluation, patient is calm and cooperative, alert and oriented x4. She is tangential at times and provides irrelevant information however overall linear, coherent in thought process. She does not appear to be responding to internal stimuli, no thought blocking noted. Patient states she was recently triggered by [name] who owns the group home she has been living at for the past 1 year. Patient states her legal guardian is [name] but is out on maternity leave. Patient shares her frustrations with current living arrangement. Does not get along with Catherine, does not like rules. States she has no freedom and Catherine doesn't allow her to do anything. Patient mentions she did run away from group home to go be with  her  boyfriend; claims her legal guardian knew where she was going. States recently Catherine violated her HIPAA rights because she reported patient to the news and she was on social media. Patient does not like her living situation and states she doesn't want to return. She denies passive and active suicidal and homicidal ideations. Denies symptoms of psychosis. No symptoms indicative of mania/ hypomania. Denies drug and alcohol use despite UDS + cocaine.    Attempted to call [name] [phone] @ [phone] EST without answer.     PAST PSYCHIATRIC HISTORY  Prior documented history of Bipolar Disorder, GAD, Intellectual Disability, Depression  Recently seen at [facility] 01/20/2024: 43 y.o. female patient with a history of Intellectual  disability presented to [facility] as a walk in accompanied by her guardian [name] with complaints of patient left the home last night without permission from the guardian. Catherine reports that patient has been chatting with random people online. Patient states that she left the home to go be with her boyfriend.  A silver alert was issued for patient.  Patient called her guardian's sister and asked her to come pick her up. Patient's guardian [name] brought patient into be evaluated.  Patient's guardian states that patient has a long history of being in group homes and ALF.  Patient has been living with her guardian [name] for a little over a year.  [name] reports the patient has been compliant with the rules of the home up until a few months ago when patient got a cell phone and every since then patient has been talking to a lot of random men on social media.   Recommended discharge with resources and follow up care with outpatient services for medication management and individual therapy.   Medication Management via [facility] in [place].   Otherwise as per HPI above.  PAST MEDICAL HISTORY  Past Medical History:   Diagnosis Date   Bipolar 1 disorder (HCC)    Diabetes mellitus without complication (HCC)    H/O tracheostomy    Hx of emotional problems    Mental retardation    Obesity    Schizophrenia (HCC)      HOME MEDICATIONS  PTA Medications  Medication Sig   paliperidone  (INVEGA ) 9 MG 24 hr tablet Take 9 mg by mouth every morning. (Patient not taking: Reported on 01/17/2024)   naproxen (NAPROSYN) 500 MG tablet Take 500 mg by mouth daily.    docusate sodium (COLACE) 100 MG capsule Take 100 mg by mouth 2 (two) times daily.   hydrOXYzine  (ATARAX /VISTARIL ) 50 MG tablet Take 50 mg by mouth 3 (three) times daily.    phenazopyridine  (PYRIDIUM ) 200 MG tablet Take 1 tablet (200 mg total) by mouth 3 (three) times daily as needed for pain. (Patient not taking: Reported on 01/17/2024)   omeprazole (PRILOSEC) 20 MG capsule Take 20 mg by mouth 2 (two) times daily.   clonazePAM (KLONOPIN) 0.5 MG tablet Take 0.5 mg by mouth 2 (two) times daily.   famotidine (PEPCID) 20 MG tablet Take 20 mg by mouth 2 (two) times daily.   FEROSUL 325 (65 Fe) MG tablet Take 325 mg by mouth daily with breakfast.   lamoTRIgine (LAMICTAL) 100 MG tablet Take 100 mg by mouth daily. (Patient not taking: Reported on 01/17/2024)   Melatonin 10 MG TABS Take 1 tablet every day by oral route at bedtime for 30 days.   Semaglutide-Weight Management (WEGOVY) 1 MG/0.5ML SOAJ Inject 1 mg into the  skin once a week. (Patient not taking: Reported on 01/17/2024)   TRINTELLIX 20 MG TABS tablet Take 20 mg by mouth daily.   ENTRESTO  24-26 MG Take 1 tablet by mouth 2 (two) times daily.   furosemide  (LASIX ) 40 MG tablet Take 1 tablet (40 mg total) by mouth as needed for fluid.   metoprolol  succinate (TOPROL  XL) 25 MG 24 hr tablet Take 1 tablet (25 mg total) by mouth daily.   spironolactone  (ALDACTONE ) 25 MG tablet Take 1 tablet (25 mg total) by mouth daily.   albuterol (VENTOLIN HFA) 108 (90 Base) MCG/ACT inhaler Inhale 1-2 puffs into the lungs every 6  (six) hours as needed.   bumetanide (BUMEX) 2 MG tablet Take 2 mg by mouth daily. as directed   empagliflozin (JARDIANCE) 10 MG TABS tablet Take 10 mg by mouth daily. (Patient not taking: Reported on 01/17/2024)   fluconazole  (DIFLUCAN ) 150 MG tablet Take 150 mg by mouth once.   nitrofurantoin , macrocrystal-monohydrate, (MACROBID ) 100 MG capsule Take 100 mg by mouth 2 (two) times daily.   ondansetron (ZOFRAN-ODT) 4 MG disintegrating tablet Take 4 mg by mouth once.   metoprolol  succinate (TOPROL -XL) 100 MG 24 hr tablet Take 100 mg by mouth daily.   clotrimazole (LOTRIMIN) 1 % cream Apply 1 Application topically 2 (two) times daily as needed (for athlete's foot).     ALLERGIES  Allergies  Allergen Reactions   Penicillins Hives and Dermatitis    SOCIAL & SUBSTANCE USE HISTORY  Social History   Socioeconomic History   Marital status: Single    Spouse name: Not on file   Number of children: Not on file   Years of education: Not on file   Highest education level: Not on file  Occupational History   Not on file  Tobacco Use   Smoking status: Former    Current packs/day: 0.00    Types: Cigarettes    Quit date: 08/07/2005    Years since quitting: 18.4   Smokeless tobacco: Never  Vaping Use   Vaping status: Never Used  Substance and Sexual Activity   Alcohol use: No   Drug use: No   Sexual activity: Yes    Birth control/protection: None  Other Topics Concern   Not on file  Social History Narrative   Not on file   Social Drivers of Health   Financial Resource Strain: Not on file  Food Insecurity: No Food Insecurity (01/17/2024)   Hunger Vital Sign    Worried About Running Out of Food in the Last Year: Never true    Ran Out of Food in the Last Year: Never true  Transportation Needs: No Transportation Needs (01/17/2024)   PRAPARE - Administrator, Civil Service (Medical): No    Lack of Transportation (Non-Medical): No  Physical Activity: Not on file  Stress: Not  on file  Social Connections: Not on file   Social History   Tobacco Use  Smoking Status Former   Current packs/day: 0.00   Types: Cigarettes   Quit date: 08/07/2005   Years since quitting: 18.4  Smokeless Tobacco Never   Social History   Substance and Sexual Activity  Alcohol Use No   Social History   Substance and Sexual Activity  Drug Use No    Additional pertinent information: lives at group home.    FAMILY HISTORY  Family History  Problem Relation Age of Onset   BRCA 1/2 Neg Hx    Breast cancer Neg Hx  Family Psychiatric History (if known):  Unknown at this time   MENTAL STATUS EXAM (MSE)  Mental Status Exam: General Appearance: Fairly Groomed  Orientation:  Full (Time, Place, and Person)  Memory:  Immediate;   Good Recent;   Good  Concentration:  Concentration: Good  Recall:  Good  Attention  Good  Eye Contact:  Good  Speech:  Clear and Coherent  Language:  Good  Volume:  Normal  Mood: Anxious  Affect:  Appropriate  Thought Process:  Coherent  Thought Content:  Logical  Suicidal Thoughts:  No  Homicidal Thoughts:  No  Judgement:  Poor  Insight:  Lacking  Psychomotor Activity:  Normal  Akathisia:  No  Fund of Knowledge:  Fair    Assets:  Manufacturing systems engineer Desire for Improvement Housing Physical Health Social Support  Cognition:  WNL  ADL's:  Intact  AIMS (if indicated):       VITALS  Blood pressure (!) 154/117, pulse (!) 110, temperature 98.5 F (36.9 C), temperature source Oral, resp. rate 20, height 5' 3 (1.6 m), weight (!) 147.6 kg.  LABS  Admission on 01/23/2024  Component Date Value Ref Range Status   Acetaminophen  (Tylenol ), Serum 01/23/2024 <10 (L)  10 - 30 ug/mL Final   Comment: (NOTE) Therapeutic concentrations vary significantly. A range of 10-30 ug/mL  may be an effective concentration for many patients. However, some  are best treated at concentrations outside of this range. Acetaminophen  concentrations >150 ug/mL at 4  hours after ingestion  and >50 ug/mL at 12 hours after ingestion are often associated with  toxic reactions.  Performed at [facility], [address] [place], [place] [address]    WBC 01/23/2024 11.4 (H)  4.0 - 10.5 K/uL Final   RBC 01/23/2024 5.12 (H)  3.87 - 5.11 MIL/uL Final   Hemoglobin 01/23/2024 12.7  12.0 - 15.0 g/dL Final   HCT 89/77/7974 38.1  36.0 - 46.0 % Final   MCV 01/23/2024 74.4 (L)  80.0 - 100.0 fL Final   MCH 01/23/2024 24.8 (L)  26.0 - 34.0 pg Final   MCHC 01/23/2024 33.3  30.0 - 36.0 g/dL Final   RDW 89/77/7974 14.3  11.5 - 15.5 % Final   Platelets 01/23/2024 286  150 - 400 K/uL Final   nRBC 01/23/2024 0.0  0.0 - 0.2 % Final   Neutrophils Relative % 01/23/2024 55  % Final   Neutro Abs 01/23/2024 6.2  1.7 - 7.7 K/uL Final   Lymphocytes Relative 01/23/2024 36  % Final   Lymphs Abs 01/23/2024 4.2 (H)  0.7 - 4.0 K/uL Final   Monocytes Relative 01/23/2024 8  % Final   Monocytes Absolute 01/23/2024 0.9  0.1 - 1.0 K/uL Final   Eosinophils Relative 01/23/2024 1  % Final   Eosinophils Absolute 01/23/2024 0.1  0.0 - 0.5 K/uL Final   Basophils Relative 01/23/2024 0  % Final   Basophils Absolute 01/23/2024 0.0  0.0 - 0.1 K/uL Final   Immature Granulocytes 01/23/2024 0  % Final   Abs Immature Granulocytes 01/23/2024 0.02  0.00 - 0.07 K/uL Final   Performed at [facility], [address], [place], [place] [address]   Sodium 01/23/2024 136  135 - 145 mmol/L Final   Potassium 01/23/2024 3.6  3.5 - 5.1 mmol/L Final   Chloride 01/23/2024 100  98 - 111 mmol/L Final   CO2 01/23/2024 25  22 - 32 mmol/L Final   Glucose, Bld 01/23/2024 75  70 -  99 mg/dL Final   Glucose reference range applies only to samples taken after fasting for at least 8 hours.   BUN 01/23/2024 7  6 - 20 mg/dL Final   Creatinine, Ser 01/23/2024 0.57  0.44 - 1.00 mg/dL Final   Calcium 89/77/7974 8.5 (L)  8.9 - 10.3 mg/dL Final   Total Protein 89/77/7974 7.7  6.5 - 8.1 g/dL Final   Albumin  89/77/7974 3.7  3.5 - 5.0 g/dL Final   AST 89/77/7974 16  15 - 41 U/L Final   ALT 01/23/2024 13  0 - 44 U/L Final   Alkaline Phosphatase 01/23/2024 73  38 - 126 U/L Final   Total Bilirubin 01/23/2024 0.7  0.0 - 1.2 mg/dL Final   GFR, Estimated 01/23/2024 >60  >60 mL/min Final   Comment: (NOTE) Calculated using the CKD-EPI Creatinine Equation (2021)    Anion gap 01/23/2024 11  5 - 15 Final   Performed at [facility], [address] [place], [place] [address]   Alcohol, Ethyl (B) 01/23/2024 <15  <15 mg/dL Final   Comment: (NOTE) For medical purposes only. Performed at [facility], [address], [place], [place] [address]    Opiates 01/23/2024 NONE DETECTED  NONE DETECTED Final   Cocaine 01/23/2024 POSITIVE (A)  NONE DETECTED Final   Benzodiazepines 01/23/2024 NONE DETECTED  NONE DETECTED Final   Amphetamines 01/23/2024 NONE DETECTED  NONE DETECTED Final   Tetrahydrocannabinol 01/23/2024 NONE DETECTED  NONE DETECTED Final   Barbiturates 01/23/2024 NONE DETECTED  NONE DETECTED Final   Comment: (NOTE) DRUG SCREEN FOR MEDICAL PURPOSES ONLY.  IF CONFIRMATION IS NEEDED FOR ANY PURPOSE, NOTIFY LAB WITHIN 5 DAYS.  LOWEST DETECTABLE LIMITS FOR URINE DRUG SCREEN Drug Class                     Cutoff (ng/mL) Amphetamine and metabolites    1000 Barbiturate and metabolites    200 Benzodiazepine                 200 Opiates and metabolites        300 Cocaine and metabolites        300 THC                            50 Performed at [facility], [address], [place], [place] [address]    Salicylate Lvl 01/23/2024 <7.0 (L)  7.0 - 30.0 mg/dL Final   Performed at [facility], [address] [place], [place] [address]   Troponin I (High Sensitivity) 01/23/2024 3  <18 ng/L Final   Comment: (NOTE) Elevated high sensitivity troponin I (hsTnI) values and significant  changes across serial measurements may suggest ACS but many other  chronic and acute conditions  are known to elevate hsTnI results.  Refer to the Links section for chest pain algorithms and additional  guidance. Performed at [facility], [address] [place], [place] [address]     PSYCHIATRIC REVIEW OF SYSTEMS (ROS)  ROS: Notable for the following relevant positive findings: Review of Systems  Psychiatric/Behavioral:  Negative for depression, hallucinations and suicidal ideas.     Additional findings:      Musculoskeletal: No abnormal movements observed      Gait & Station: Laying/Sitting      Pain Screening: Denies      Nutrition & Dental Concerns: Decrease in food intake and/or loss  of appetite  RISK FORMULATION/ASSESSMENT  Is the patient experiencing any suicidal or homicidal ideations: No       Explain if yes:  Protective factors considered for safety management: access to care  Risk factors/concerns considered for safety management:  Depression Substance abuse/dependence Impulsivity Unmarried  Is there a safety management plan with the patient and treatment team to minimize risk factors and promote protective factors: Yes           Explain: currently in the ED, recommend discharge with outpatient follow-up with medication management and therapy.   Is crisis care placement or psychiatric hospitalization recommended: No     Based on my current evaluation and risk assessment, patient is determined at this time to be at:  Low risk  *RISK ASSESSMENT Risk assessment is a dynamic process; it is possible that this patient's condition, and risk level, may change. This should be re-evaluated and managed over time as appropriate. Please re-consult psychiatric consult services if additional assistance is needed in terms of risk assessment and management. If your team decides to discharge this patient, please advise the patient how to best access emergency psychiatric services, or to call 911, if their condition worsens or they feel unsafe in any way.   Stormey Wilborn E  Saragrace Selke, NP Telepsychiatry Consult Services

## 2024-01-24 NOTE — Progress Notes (Signed)
 Chart reviewed and guardian contacted to discuss plan of care measures recommended to move forward with. Guardian spoken to at [phone], Ms. [name].  Ms. Public reports no safety concerns with the patient returning home after psychiatric clearance, endorses that she will pick up the patient within the hour.  Discussed recommendations provided from IRIS consult, no questions or concerns after lengthy discussion.

## 2024-01-25 ENCOUNTER — Encounter: Payer: Self-pay | Admitting: Family Medicine

## 2024-01-25 ENCOUNTER — Other Ambulatory Visit: Payer: Self-pay | Admitting: Family Medicine

## 2024-01-25 ENCOUNTER — Encounter: Payer: Self-pay | Admitting: Cardiology

## 2024-01-25 ENCOUNTER — Other Ambulatory Visit (HOSPITAL_COMMUNITY): Payer: Self-pay

## 2024-01-25 MED ORDER — ENTRESTO 24-26 MG PO TABS
1.0000 | ORAL_TABLET | Freq: Two times a day (BID) | ORAL | 2 refills | Status: DC
  Filled 2024-01-25: qty 180, 90d supply, fill #0

## 2024-01-25 MED ORDER — FAMOTIDINE 20 MG PO TABS: 20.0000 mg | ORAL_TABLET | Freq: Two times a day (BID) | ORAL | 1 refills | Status: AC

## 2024-01-25 MED ORDER — SPIRONOLACTONE 25 MG PO TABS
25.0000 mg | ORAL_TABLET | Freq: Every day | ORAL | 2 refills | Status: DC
  Filled 2024-01-25: qty 90, 90d supply, fill #0

## 2024-01-25 MED ORDER — FUROSEMIDE 40 MG PO TABS
40.0000 mg | ORAL_TABLET | ORAL | 2 refills | Status: DC | PRN
  Filled 2024-01-25: qty 90, 90d supply, fill #0

## 2024-01-25 MED ORDER — ALBUTEROL SULFATE HFA 108 (90 BASE) MCG/ACT IN AERS: 1.0000 | INHALATION_SPRAY | Freq: Four times a day (QID) | RESPIRATORY_TRACT | 1 refills | Status: AC | PRN

## 2024-01-28 ENCOUNTER — Other Ambulatory Visit (HOSPITAL_BASED_OUTPATIENT_CLINIC_OR_DEPARTMENT_OTHER): Payer: Self-pay

## 2024-01-28 MED ORDER — FUROSEMIDE 40 MG PO TABS: 40.0000 mg | ORAL_TABLET | ORAL | 1 refills | Status: AC | PRN

## 2024-01-28 MED ORDER — ENTRESTO 24-26 MG PO TABS: 1.0000 | ORAL_TABLET | Freq: Two times a day (BID) | ORAL | 1 refills | Status: DC

## 2024-01-28 MED ORDER — SPIRONOLACTONE 25 MG PO TABS: 25.0000 mg | ORAL_TABLET | Freq: Every day | ORAL | 1 refills | Status: AC

## 2024-01-28 MED ORDER — METOPROLOL SUCCINATE ER 100 MG PO TB24: 100.0000 mg | ORAL_TABLET | Freq: Every day | ORAL | 1 refills | Status: DC

## 2024-01-29 ENCOUNTER — Other Ambulatory Visit: Payer: Self-pay | Admitting: Family Medicine

## 2024-01-29 MED ORDER — HYDROXYZINE HCL 50 MG PO TABS: 50.0000 mg | ORAL_TABLET | Freq: Every day | ORAL | 0 refills | Status: DC

## 2024-01-31 ENCOUNTER — Ambulatory Visit (HOSPITAL_BASED_OUTPATIENT_CLINIC_OR_DEPARTMENT_OTHER): Payer: MEDICAID | Admitting: Certified Nurse Midwife

## 2024-01-31 ENCOUNTER — Other Ambulatory Visit (HOSPITAL_COMMUNITY)
Admission: RE | Admit: 2024-01-31 | Discharge: 2024-01-31 | Disposition: A | Payer: MEDICAID | Source: Ambulatory Visit | Attending: Certified Nurse Midwife | Admitting: Certified Nurse Midwife

## 2024-01-31 ENCOUNTER — Encounter (HOSPITAL_BASED_OUTPATIENT_CLINIC_OR_DEPARTMENT_OTHER): Payer: Self-pay | Admitting: Certified Nurse Midwife

## 2024-01-31 VITALS — BP 137/99 | HR 89 | Ht 63.0 in | Wt 331.6 lb

## 2024-01-31 DIAGNOSIS — Z01419 Encounter for gynecological examination (general) (routine) without abnormal findings: Secondary | ICD-10-CM

## 2024-01-31 DIAGNOSIS — Z23 Encounter for immunization: Secondary | ICD-10-CM

## 2024-01-31 DIAGNOSIS — Z124 Encounter for screening for malignant neoplasm of cervix: Secondary | ICD-10-CM

## 2024-01-31 DIAGNOSIS — Z1151 Encounter for screening for human papillomavirus (HPV): Secondary | ICD-10-CM

## 2024-01-31 DIAGNOSIS — Z1231 Encounter for screening mammogram for malignant neoplasm of breast: Secondary | ICD-10-CM

## 2024-01-31 DIAGNOSIS — Z30431 Encounter for routine checking of intrauterine contraceptive device: Secondary | ICD-10-CM

## 2024-01-31 NOTE — Progress Notes (Signed)
 43 y.o. G0P0000 Single Black or African American female here for annual exam.    No LMP recorded. (Menstrual status: IUD).          Sexually active: Yes.    The current method of family planning is IUD.     Exercising: Yes.     Smoker:  no  Health Maintenance: Pap:  Last pap many years ago? History of abnormal Pap:  no MMG:  Ordered   reports that she quit smoking about 18 years ago. Her smoking use included cigarettes. She has never used smokeless tobacco. She reports that she does not drink alcohol and does not use drugs.  Past Medical History:  Diagnosis Date   Bipolar 1 disorder (HCC)    Diabetes mellitus without complication (HCC)    H/O tracheostomy    Hx of emotional problems    Mental retardation    Obesity    Schizophrenia (HCC)     Past Surgical History:  Procedure Laterality Date   TONSILLECTOMY      Current Outpatient Medications  Medication Sig Dispense Refill   albuterol (VENTOLIN HFA) 108 (90 Base) MCG/ACT inhaler Inhale 1-2 puffs into the lungs every 6 (six) hours as needed. 8 g 1   clonazePAM (KLONOPIN) 0.5 MG tablet Take 0.5 mg by mouth 2 (two) times daily.     ENTRESTO  24-26 MG Take 1 tablet by mouth 2 (two) times daily. 180 tablet 1   famotidine (PEPCID) 20 MG tablet Take 1 tablet (20 mg total) by mouth 2 (two) times daily. 60 tablet 1   FEROSUL 325 (65 Fe) MG tablet Take 325 mg by mouth daily with breakfast.     furosemide  (LASIX ) 40 MG tablet Take 1 tablet (40 mg total) by mouth as needed for fluid. 90 tablet 1   hydrOXYzine  (ATARAX ) 50 MG tablet Take 1 tablet (50 mg total) by mouth daily. 30 tablet 0   lamoTRIgine (LAMICTAL) 100 MG tablet Take 100 mg by mouth daily.     Melatonin 10 MG TABS Take 1 tablet every day by oral route at bedtime for 30 days.     metoprolol  succinate (TOPROL -XL) 100 MG 24 hr tablet Take 1 tablet (100 mg total) by mouth daily. 90 tablet 1   naproxen (NAPROSYN) 500 MG tablet Take 500 mg by mouth daily as needed for moderate  pain (pain score 4-6).     spironolactone  (ALDACTONE ) 25 MG tablet Take 1 tablet (25 mg total) by mouth daily. 90 tablet 1   TRINTELLIX 20 MG TABS tablet Take 20 mg by mouth daily.     No current facility-administered medications for this visit.    Family History  Problem Relation Age of Onset   BRCA 1/2 Neg Hx    Breast cancer Neg Hx     ROS: Constitutional: negative Genitourinary:negative  Exam:   BP (!) 137/99   Pulse 89   Ht 5' 3 (1.6 m) Comment: Reported  Wt (!) 331 lb 9.6 oz (150.4 kg)   BMI 58.74 kg/m   Height: 5' 3 (160 cm) (Reported)  General appearance: alert, cooperative and appears stated age Head: Normocephalic, without obvious abnormality, atraumatic Lungs: clear to auscultation bilaterally Breasts: normal appearance, no masses or tenderness, Inspection negative, No nipple retraction or dimpling, No nipple discharge or bleeding, No axillary or supraclavicular adenopathy, Normal to palpation without dominant masses Heart: regular rate and rhythm Abdomen: soft, non-tender; bowel sounds normal; no masses,  no organomegaly Extremities: extremities normal, atraumatic, no cyanosis or edema  Skin: Skin color, texture, turgor normal. No rashes or lesions Lymph nodes: Cervical, supraclavicular, and axillary nodes normal. No abnormal inguinal nodes palpated Neurologic: Grossly normal   Pelvic: External genitalia:  no lesions              Urethra:  normal appearing urethra with no masses, tenderness or lesions              Bartholins and Skenes: normal                 Vagina: normal appearing vagina with normal color and no discharge, no lesions              Cervix: no bleeding following Pap, no cervical motion tenderness, no lesions, and nulliparous appearance IUD strings not visible or palpable (US  ordered)              Pap taken: Yes.   Bimanual Exam:  Uterus:  normal size, contour, position, consistency, mobility, non-tender              Adnexa: no mass, fullness,  tenderness               Rectovaginal: Confirms               Anus:  normal sphincter tone, no lesions  Chaperone CMA, was present for exam.  Assessment/Plan:  1. Encounter for gynecological examination without abnormal finding (Primary)  2. Cervical cancer screening - Cytology - PAP( [place]) - Routine STI Screening on pap smear  3. Need for influenza vaccination - Flu vaccine trivalent PF, 6mos and older(Flulaval,Afluria,Fluarix,Fluzone)  4. Surveillance of contraceptive intrauterine device (IUD) performed - IUD strings not visible or palpable. US  planned to confirm IUD in place - US  PELVIC COMPLETE WITH TRANSVAGINAL; Future  5. Breast cancer screening by mammogram - MM 3D SCREENING MAMMOGRAM BILATERAL BREAST; Future   RTO 1 year for annual gyn exam and prn if issues arise. Arland MARLA Roller

## 2024-02-04 ENCOUNTER — Ambulatory Visit (HOSPITAL_BASED_OUTPATIENT_CLINIC_OR_DEPARTMENT_OTHER): Payer: Self-pay | Admitting: Certified Nurse Midwife

## 2024-02-04 LAB — CYTOLOGY - PAP
Adequacy: ABSENT
Chlamydia: NEGATIVE
Comment: NEGATIVE
Comment: NEGATIVE
Comment: NEGATIVE
Comment: NORMAL
Diagnosis: NEGATIVE
Diagnosis: REACTIVE
High risk HPV: NEGATIVE
Neisseria Gonorrhea: NEGATIVE
Trichomonas: NEGATIVE

## 2024-02-07 DIAGNOSIS — F39 Unspecified mood [affective] disorder: Secondary | ICD-10-CM

## 2024-02-07 DIAGNOSIS — F79 Unspecified intellectual disabilities: Secondary | ICD-10-CM

## 2024-02-09 ENCOUNTER — Other Ambulatory Visit: Payer: Self-pay | Admitting: Family Medicine

## 2024-02-12 ENCOUNTER — Other Ambulatory Visit: Payer: Self-pay | Admitting: Family Medicine

## 2024-02-13 ENCOUNTER — Other Ambulatory Visit: Payer: Self-pay | Admitting: Family Medicine

## 2024-02-13 NOTE — Telephone Encounter (Unsigned)
 Copied from CRM [id number]. Topic: Clinical - Medication Refill >> Feb 13, 2024 10:51 AM Tobias L wrote: Medication: hydrOXYzine  (ATARAX ) 50 MG tablet Ginny with [facility] requesting refill be sent to their pharmacy. They are attempting to complete patient's blister pack to send to her before upcoming holiday.   Has the patient contacted their pharmacy? Yes Pharmacy calling on behalf of patient to request refill.   This is the patient's preferred pharmacy:  [facility] - [place], [place] - [address] [place] [address] Phone: [phone] Fax: [phone]  Is this the correct pharmacy for this prescription? Yes If no, delete pharmacy and type the correct one.   Has the prescription been filled recently? No  Is the patient out of the medication? no  Has the patient been seen for an appointment in the last year OR does the patient have an upcoming appointment? Yes  Can we respond through MyChart? No  Agent: Please be advised that Rx refills may take up to 3 business days. We ask that you follow-up with your pharmacy.

## 2024-02-15 MED ORDER — HYDROXYZINE HCL 50 MG PO TABS: 50.0000 mg | ORAL_TABLET | Freq: Every day | ORAL | 0 refills | Status: DC

## 2024-02-15 NOTE — Telephone Encounter (Signed)
 Requested Prescriptions  Pending Prescriptions Disp Refills   hydrOXYzine  (ATARAX ) 50 MG tablet 30 tablet 0    Sig: Take 1 tablet (50 mg total) by mouth daily.     Ear, Nose, and Throat:  Antihistamines 2 Passed - 02/15/2024 11:27 AM      Passed - Cr in normal range and within 360 days    Creatinine  Date Value Ref Range Status  07/15/2014 0.65 mg/dL Final    Comment:    9.55-8.99 NOTE: New Reference Range  06/09/14    Creatinine, Ser  Date Value Ref Range Status  01/23/2024 0.57 0.44 - 1.00 mg/dL Final         Passed - Valid encounter within last 12 months    Recent Outpatient Visits           4 weeks ago Intertriginous candidiasis   [facility], MD       Future Appointments             In 2 months Patwardhan, Newman PARAS, MD [facility] [employer], [employer]

## 2024-03-04 ENCOUNTER — Other Ambulatory Visit (HOSPITAL_BASED_OUTPATIENT_CLINIC_OR_DEPARTMENT_OTHER): Payer: Self-pay

## 2024-03-04 DIAGNOSIS — Z30431 Encounter for routine checking of intrauterine contraceptive device: Secondary | ICD-10-CM

## 2024-03-05 ENCOUNTER — Other Ambulatory Visit (HOSPITAL_BASED_OUTPATIENT_CLINIC_OR_DEPARTMENT_OTHER): Payer: MEDICAID

## 2024-03-05 ENCOUNTER — Other Ambulatory Visit (HOSPITAL_BASED_OUTPATIENT_CLINIC_OR_DEPARTMENT_OTHER): Payer: MEDICAID | Admitting: Obstetrics & Gynecology

## 2024-03-05 ENCOUNTER — Other Ambulatory Visit: Payer: Self-pay | Admitting: Family Medicine

## 2024-03-05 ENCOUNTER — Encounter (HOSPITAL_BASED_OUTPATIENT_CLINIC_OR_DEPARTMENT_OTHER): Payer: Self-pay

## 2024-03-05 NOTE — Progress Notes (Deleted)
   GYNECOLOGY  VISIT  CC:   No chief complaint on file.   HPI: 43 y.o. G0P0000 Single Black or African American female here for follow up from ultrasound.  No LMP recorded. (Menstrual status: IUD).  Past Medical History:  Diagnosis Date   Bipolar 1 disorder (HCC)    Diabetes mellitus without complication (HCC)    H/O tracheostomy    Hx of emotional problems    Mental retardation    Obesity    Schizophrenia (HCC)     MEDS:  Reviewed in EPIC  ALLERGIES: Penicillins  SH:  ***  ROS  PHYSICAL EXAMINATION:    There were no vitals taken for this visit.    General appearance: alert, cooperative and appears stated age Neck: no adenopathy, supple, symmetrical, trachea midline and thyroid {CHL AMB PHY EX THYROID NORM DEFAULT:[phone]::normal to inspection and palpation} CV:  {Exam; heart brief:31539} Lungs:  {pe lungs ob:314451} Breasts: {Exam; breast:13139::normal appearance, no masses or tenderness} Abdomen: soft, non-tender; bowel sounds normal; no masses,  no organomegaly Lymph:  no inguinal LAD noted  Pelvic: External genitalia:  no lesions              Urethra:  normal appearing urethra with no masses, tenderness or lesions              Bartholins and Skenes: normal                 Vagina: {exam; pelvic vaginal:30846}              Cervix: {CHL AMB PHY EX CERVIX NORM DEFAULT:[phone]::no lesions}              Bimanual Exam:  Uterus:  {CHL AMB PHY EX UTERUS NORM DEFAULT:[phone]::normal size, contour, position, consistency, mobility, non-tender}              Adnexa: {CHL AMB PHY EX ADNEXA NO MASS [phone]::no mass, fullness, tenderness}              Rectovaginal: {yes no:314532}.  Confirms.              Anus:  normal sphincter tone, no lesions  Chaperone was present for exam.  Assessment/Plan: There are no diagnoses linked to this encounter.

## 2024-03-18 ENCOUNTER — Ambulatory Visit (INDEPENDENT_AMBULATORY_CARE_PROVIDER_SITE_OTHER): Payer: MEDICAID | Admitting: Family Medicine

## 2024-03-18 ENCOUNTER — Encounter: Payer: Self-pay | Admitting: Family Medicine

## 2024-03-18 VITALS — BP 137/82 | HR 79 | Ht 63.0 in | Wt 333.4 lb

## 2024-03-18 DIAGNOSIS — I10 Essential (primary) hypertension: Secondary | ICD-10-CM | POA: Diagnosis not present

## 2024-03-18 DIAGNOSIS — R7303 Prediabetes: Secondary | ICD-10-CM | POA: Diagnosis not present

## 2024-03-18 DIAGNOSIS — Z6841 Body Mass Index (BMI) 40.0 and over, adult: Secondary | ICD-10-CM | POA: Diagnosis not present

## 2024-03-18 DIAGNOSIS — E66813 Obesity, class 3: Secondary | ICD-10-CM | POA: Diagnosis not present

## 2024-03-18 MED ORDER — PHENTERMINE HCL 37.5 MG PO TABS: 37.5000 mg | ORAL_TABLET | Freq: Every day | ORAL | 0 refills | Status: AC

## 2024-03-18 NOTE — Progress Notes (Unsigned)
° °  Ultrasound f/u Patient name: [name] MRN [id number]  Date of birth: [birth year] Chief Complaint:   Office visit (Follow up ultrasound)  History of Present Illness:   [name] is a 43 y.o. G0P0000 African-American female being seen today for discussion of ultrasound findings.  Has non visualized IUD string which was noted on ultrasound today.  It is in the correct location.  Pt is accompanied by her guardian today.  IUD was placed at [facility] (?) office per pt.  There is no documentation of this in EPIC.  Unsure of exact timing.  Pt is ready to have this removed.  Does not want removed in office.  Is accompanied by her guardian today.    Hysteroscopy for removal discussed.  Procedure discussed with patient.  Recovery and pain management discussed.  Risks discussed including but not limited to bleeding, rare risk of transfusion, infection, 1% risk of uterine perforation with risks of fluid deficit causing cardiac arrythmia, cerebral swelling and/or need to stop procedure early.  Fluid emboli and rare risk of death discussed.  DVT/PE, rare risk of risk of bowel/bladder/ureteral/vascular injury.  Patient aware if pathology abnormal she may need additional treatment.  All questions answered.    They would like to proceed with scheduling.  Will need to change to OCP after IUD removal.  Discussed as well today and pt/guardian comfortable with plan of care.  No LMP recorded. (Menstrual status: IUD).  Last pap 01/31/2024. Results were: NILM w/ HRHPV negative.  Review of Systems:   Pertinent items are noted in HPI Denies any urinary or bowel changes or pelvic pain Pertinent History Reviewed:  Reviewed past medical,surgical, social and family history.  Reviewed problem list, medications and allergies. Physical Assessment:   Vitals:   03/19/24 0914  BP: 123/69  Pulse: 76  Weight: (!) 331 lb (150.1 kg)  Body mass index is 58.63 kg/m.        Physical Examination:   General  appearance - well appearing, and in no distress  Mental status - alert, oriented to person, place, and time  Psych:  She has a normal mood and affect    Assessment & Plan:  1. Intrauterine contraceptive device threads lost, subsequent encounter (Primary) - pt desires IUD removal in OR.  Surgical request will be sent.  2. Schizophrenia, unspecified type (HCC)  3. Developmental delay - guardian accompanied pt today  4. History of cocaine use - h/o +UDS 01/2024  5. Type 2 diabetes mellitus without complication, without long-term current use of insulin (HCC) - will check hba1 prior to procedure  6. History of heart failure - will reach out to cardiology about specific clearance.  Had cardiac echo 04/12/2023 with EF 50 -55% but somewhat limited study.   No orders of the defined types were placed in this encounter.   Ronal GORMAN Pinal, MD 03/19/2024 3:43 PM GYNECOLOGY  VISIT

## 2024-03-18 NOTE — Progress Notes (Unsigned)
 Established Patient Office Visit  Subjective    Patient ID: [name], female    DOB: 1980/06/29  Age: 43 y.o. MRN: [id number]  CC:  Chief Complaint  Patient presents with   Medical Management of Chronic Issues    HPI [name] presents desiring weight loss assistance. She does have history of hypertension and prediabetes. She denies acute complaints.   Outpatient Encounter Medications as of 03/18/2024  Medication Sig   albuterol  (VENTOLIN  HFA) 108 (90 Base) MCG/ACT inhaler Inhale 1-2 puffs into the lungs every 6 (six) hours as needed.   clonazePAM (KLONOPIN) 0.5 MG tablet Take 0.5 mg by mouth 2 (two) times daily.   ENTRESTO  24-26 MG Take 1 tablet by mouth 2 (two) times daily.   famotidine  (PEPCID ) 20 MG tablet Take 1 tablet (20 mg total) by mouth 2 (two) times daily.   FEROSUL 325 (65 Fe) MG tablet Take 325 mg by mouth daily with breakfast.   furosemide  (LASIX ) 40 MG tablet Take 1 tablet (40 mg total) by mouth as needed for fluid.   hydrOXYzine  (ATARAX ) 50 MG tablet Take 1 tablet (50 mg total) by mouth daily.   lamoTRIgine (LAMICTAL) 100 MG tablet Take 100 mg by mouth daily.   Melatonin 10 MG TABS Take 1 tablet every day by oral route at bedtime for 30 days.   metoprolol  succinate (TOPROL -XL) 100 MG 24 hr tablet Take 1 tablet (100 mg total) by mouth daily.   naproxen (NAPROSYN) 500 MG tablet Take 500 mg by mouth daily as needed for moderate pain (pain score 4-6).   phentermine  (ADIPEX-P ) 37.5 MG tablet Take 1 tablet (37.5 mg total) by mouth daily before breakfast.   spironolactone  (ALDACTONE ) 25 MG tablet Take 1 tablet (25 mg total) by mouth daily.   TRINTELLIX 20 MG TABS tablet Take 20 mg by mouth daily.   No facility-administered encounter medications on file as of 03/18/2024.    Past Medical History:  Diagnosis Date   Bipolar 1 disorder (HCC)    Diabetes mellitus without complication (HCC)    H/O tracheostomy    Hx of emotional problems    Mental  retardation    Obesity    Schizophrenia (HCC)     Past Surgical History:  Procedure Laterality Date   TONSILLECTOMY      Family History  Problem Relation Age of Onset   BRCA 1/2 Neg Hx    Breast cancer Neg Hx     Social History   Socioeconomic History   Marital status: Single    Spouse name: Not on file   Number of children: Not on file   Years of education: Not on file   Highest education level: Not on file  Occupational History   Not on file  Tobacco Use   Smoking status: Former    Current packs/day: 0.00    Types: Cigarettes    Quit date: 08/07/2005    Years since quitting: 18.6   Smokeless tobacco: Never  Vaping Use   Vaping status: Never Used  Substance and Sexual Activity   Alcohol use: No   Drug use: No   Sexual activity: Yes    Birth control/protection: None  Other Topics Concern   Not on file  Social History Narrative   Not on file   Social Drivers of Health   Tobacco Use: Medium Risk (03/18/2024)   Patient History    Smoking Tobacco Use: Former    Smokeless Tobacco Use: Never    Passive  Exposure: Not on file  Financial Resource Strain: Not on file  Food Insecurity: No Food Insecurity (01/17/2024)   Epic    Worried About Programme Researcher, Broadcasting/film/video in the Last Year: Never true    Ran Out of Food in the Last Year: Never true  Transportation Needs: No Transportation Needs (01/17/2024)   Epic    Lack of Transportation (Medical): No    Lack of Transportation (Non-Medical): No  Physical Activity: Not on file  Stress: Not on file  Social Connections: Not on file  Intimate Partner Violence: Not At Risk (01/17/2024)   Epic    Fear of Current or Ex-Partner: No    Emotionally Abused: No    Physically Abused: No    Sexually Abused: No  Depression (PHQ2-9): Medium Risk (01/31/2024)   Depression (PHQ2-9)    PHQ-2 Score: 9  Alcohol Screen: Not on file  Housing: Low Risk (01/17/2024)   Epic    Unable to Pay for Housing in the Last Year: No    Number of  Times Moved in the Last Year: 0    Homeless in the Last Year: No  Utilities: Not At Risk (01/17/2024)   Epic    Threatened with loss of utilities: No  Health Literacy: Not on file    Review of Systems  All other systems reviewed and are negative.       Objective    BP 137/82   Pulse 79   Ht 5' 3 (1.6 m)   Wt (!) 333 lb 6.4 oz (151.2 kg)   SpO2 98%   BMI 59.06 kg/m   Physical Exam Vitals and nursing note reviewed.  Constitutional:      General: She is not in acute distress.    Appearance: She is obese.  Cardiovascular:     Rate and Rhythm: Normal rate and regular rhythm.  Pulmonary:     Effort: Pulmonary effort is normal.     Breath sounds: Normal breath sounds.  Abdominal:     Palpations: Abdomen is soft.     Tenderness: There is no abdominal tenderness.  Neurological:     General: No focal deficit present.     Mental Status: She is alert and oriented to person, place, and time.         Assessment & Plan:   Encounter for weight management  Class 3 severe obesity due to excess calories with serious comorbidity and body mass index (BMI) of 50.0 to 59.9 in adult [facility])  Essential hypertension  Prediabetes  Other orders -     Phentermine  HCl; Take 1 tablet (37.5 mg total) by mouth daily before breakfast.  Dispense: 30 tablet; Refill: 0     Return in about 4 weeks (around 04/15/2024) for follow up, weight management.   Tanda Raguel SQUIBB, MD

## 2024-03-18 NOTE — Telephone Encounter (Signed)
 Copied from CRM #[id number]. Topic: Clinical - Medication Question >> Mar 18, 2024  2:54 PM Aisha D wrote: Reason for CRM: [facility] is requesting a refill request for the hydrOXYzine  (VISTARIL ) 50 MG capsule.

## 2024-03-19 ENCOUNTER — Ambulatory Visit (INDEPENDENT_AMBULATORY_CARE_PROVIDER_SITE_OTHER): Payer: MEDICAID | Admitting: Obstetrics & Gynecology

## 2024-03-19 ENCOUNTER — Encounter (HOSPITAL_BASED_OUTPATIENT_CLINIC_OR_DEPARTMENT_OTHER): Payer: Self-pay | Admitting: Obstetrics & Gynecology

## 2024-03-19 ENCOUNTER — Encounter (HOSPITAL_BASED_OUTPATIENT_CLINIC_OR_DEPARTMENT_OTHER): Payer: Self-pay

## 2024-03-19 ENCOUNTER — Other Ambulatory Visit (HOSPITAL_BASED_OUTPATIENT_CLINIC_OR_DEPARTMENT_OTHER): Payer: MEDICAID

## 2024-03-19 VITALS — BP 123/69 | HR 76 | Wt 331.0 lb

## 2024-03-19 DIAGNOSIS — T8332XA Displacement of intrauterine contraceptive device, initial encounter: Secondary | ICD-10-CM

## 2024-03-19 DIAGNOSIS — Z30431 Encounter for routine checking of intrauterine contraceptive device: Secondary | ICD-10-CM

## 2024-03-19 DIAGNOSIS — F1491 Cocaine use, unspecified, in remission: Secondary | ICD-10-CM

## 2024-03-19 DIAGNOSIS — Z8679 Personal history of other diseases of the circulatory system: Secondary | ICD-10-CM | POA: Diagnosis not present

## 2024-03-19 DIAGNOSIS — T8332XD Displacement of intrauterine contraceptive device, subsequent encounter: Secondary | ICD-10-CM

## 2024-03-19 DIAGNOSIS — R6250 Unspecified lack of expected normal physiological development in childhood: Secondary | ICD-10-CM | POA: Diagnosis not present

## 2024-03-19 DIAGNOSIS — F209 Schizophrenia, unspecified: Secondary | ICD-10-CM

## 2024-03-19 DIAGNOSIS — E119 Type 2 diabetes mellitus without complications: Secondary | ICD-10-CM

## 2024-03-19 DIAGNOSIS — Z8659 Personal history of other mental and behavioral disorders: Secondary | ICD-10-CM

## 2024-03-21 ENCOUNTER — Emergency Department (HOSPITAL_COMMUNITY)
Admission: EM | Admit: 2024-03-21 | Discharge: 2024-03-22 | Disposition: A | Discharge status: Home / Self Care | Payer: MEDICAID | Attending: Emergency Medicine | Admitting: Emergency Medicine

## 2024-03-21 ENCOUNTER — Other Ambulatory Visit: Payer: Self-pay

## 2024-03-21 ENCOUNTER — Encounter (HOSPITAL_COMMUNITY): Payer: Self-pay | Admitting: *Deleted

## 2024-03-21 DIAGNOSIS — Z7984 Long term (current) use of oral hypoglycemic drugs: Secondary | ICD-10-CM | POA: Diagnosis not present

## 2024-03-21 DIAGNOSIS — Z79899 Other long term (current) drug therapy: Secondary | ICD-10-CM | POA: Diagnosis not present

## 2024-03-21 DIAGNOSIS — I509 Heart failure, unspecified: Secondary | ICD-10-CM | POA: Diagnosis not present

## 2024-03-21 DIAGNOSIS — F419 Anxiety disorder, unspecified: Secondary | ICD-10-CM | POA: Diagnosis present

## 2024-03-21 DIAGNOSIS — R519 Headache, unspecified: Secondary | ICD-10-CM | POA: Diagnosis not present

## 2024-03-21 DIAGNOSIS — J45909 Unspecified asthma, uncomplicated: Secondary | ICD-10-CM | POA: Insufficient documentation

## 2024-03-21 DIAGNOSIS — W19XXXA Unspecified fall, initial encounter: Secondary | ICD-10-CM | POA: Insufficient documentation

## 2024-03-21 DIAGNOSIS — E119 Type 2 diabetes mellitus without complications: Secondary | ICD-10-CM | POA: Insufficient documentation

## 2024-03-21 DIAGNOSIS — M25562 Pain in left knee: Secondary | ICD-10-CM | POA: Insufficient documentation

## 2024-03-21 DIAGNOSIS — F919 Conduct disorder, unspecified: Secondary | ICD-10-CM | POA: Diagnosis present

## 2024-03-21 DIAGNOSIS — F319 Bipolar disorder, unspecified: Secondary | ICD-10-CM | POA: Diagnosis not present

## 2024-03-21 DIAGNOSIS — M25461 Effusion, right knee: Secondary | ICD-10-CM

## 2024-03-21 DIAGNOSIS — Z9151 Personal history of suicidal behavior: Secondary | ICD-10-CM | POA: Diagnosis not present

## 2024-03-21 DIAGNOSIS — M25561 Pain in right knee: Secondary | ICD-10-CM | POA: Insufficient documentation

## 2024-03-21 DIAGNOSIS — R45851 Suicidal ideations: Secondary | ICD-10-CM | POA: Diagnosis not present

## 2024-03-21 LAB — CBC
HCT: 37.8 % (ref 36.0–46.0)
Hemoglobin: 12.7 g/dL (ref 12.0–15.0)
MCH: 25.2 pg — ABNORMAL LOW (ref 26.0–34.0)
MCHC: 33.6 g/dL (ref 30.0–36.0)
MCV: 75.1 fL — ABNORMAL LOW (ref 80.0–100.0)
Platelets: 302 K/uL (ref 150–400)
RBC: 5.03 MIL/uL (ref 3.87–5.11)
RDW: 14.4 % (ref 11.5–15.5)
WBC: 10.5 K/uL (ref 4.0–10.5)
nRBC: 0 % (ref 0.0–0.2)

## 2024-03-21 LAB — COMPREHENSIVE METABOLIC PANEL WITH GFR
ALT: 14 U/L (ref 0–44)
AST: 24 U/L (ref 15–41)
Albumin: 3.9 g/dL (ref 3.5–5.0)
Alkaline Phosphatase: 96 U/L (ref 38–126)
Anion gap: 10 (ref 5–15)
BUN: 8 mg/dL (ref 6–20)
CO2: 26 mmol/L (ref 22–32)
Calcium: 9.1 mg/dL (ref 8.9–10.3)
Chloride: 105 mmol/L (ref 98–111)
Creatinine, Ser: 0.61 mg/dL (ref 0.44–1.00)
GFR, Estimated: >60 mL/min
Glucose, Bld: 82 mg/dL (ref 70–99)
Potassium: 3.9 mmol/L (ref 3.5–5.1)
Sodium: 140 mmol/L (ref 135–145)
Total Bilirubin: 0.9 mg/dL (ref 0.0–1.2)
Total Protein: 7.8 g/dL (ref 6.5–8.1)

## 2024-03-21 LAB — HCG, SERUM, QUALITATIVE: Preg, Serum: NEGATIVE

## 2024-03-21 LAB — MAGNESIUM: Magnesium: 2.1 mg/dL (ref 1.7–2.4)

## 2024-03-21 LAB — CBG MONITORING, ED: Glucose-Capillary: 77 mg/dL (ref 70–99)

## 2024-03-21 NOTE — ED Notes (Signed)
 [name] (legal guardian) requesting to speak to nurse [phone]

## 2024-03-21 NOTE — ED Provider Triage Note (Signed)
 Emergency Medicine Provider Triage Evaluation Note  [name] , a 44 y.o. female  was evaluated in triage.  Patient presents with weakness that has been ongoing for several months.  Patient states that she has been having falls frequently.  Patient states that she fell today taking the trash out-denies hitting her head, denies loss of consciousness, denies headache coagulant use.  Patient denies any nausea, vomiting, diarrhea, or fevers.  Patient denies any chest pain or shortness of breath.  Patient denies any headaches or other neurological symptoms.  The patient is in no acute distress.   Review of Systems  Positive: Weakness Negative: Fevers  Physical Exam  BP (!) 154/116   Pulse 99   Temp (!) 97.4 F (36.3 C)   Resp 20   Ht 5' 3 (1.6 m)   Wt (!) 150.1 kg   SpO2 96%   BMI 58.62 kg/m  Gen:   Awake, no distress   Resp:  Normal effort  MSK:   Moves extremities without difficulty  Other:  Neuro exam unremarkable  Medical Decision Making  Medically screening exam initiated at 8:18 PM.  Appropriate orders placed.  [name] was informed that the remainder of the evaluation will be completed by another provider, this initial triage assessment does not replace that evaluation, and the importance of remaining in the ED until their evaluation is complete.    [name], [place] 03/21/24 2022

## 2024-03-21 NOTE — ED Triage Notes (Signed)
 Patient states that she is having leg pain and dizziness. Patient states that she she is weak and fell about 3 weeks ago and has had leg pain since then. She states that the dizziness started today. Her caregiver brought her in today and was explaining that patient was told by PCP that she needed to lose weight and stop carrying her heavy bags around to help stop falling. Patient tearful on arrival to ED and stating she is just mad.

## 2024-03-22 ENCOUNTER — Emergency Department (HOSPITAL_COMMUNITY)
Admission: EM | Admit: 2024-03-22 | Discharge: 2024-03-22 | Disposition: A | Discharge status: Home / Self Care | Payer: MEDICAID | Source: Home / Self Care | Attending: Emergency Medicine | Admitting: Emergency Medicine

## 2024-03-22 ENCOUNTER — Other Ambulatory Visit: Payer: Self-pay

## 2024-03-22 ENCOUNTER — Ambulatory Visit (HOSPITAL_COMMUNITY)
Admission: EM | Admit: 2024-03-22 | Discharge: 2024-03-22 | Disposition: A | Discharge status: Home / Self Care | Payer: MEDICAID

## 2024-03-22 ENCOUNTER — Emergency Department (HOSPITAL_COMMUNITY): Payer: MEDICAID

## 2024-03-22 DIAGNOSIS — F419 Anxiety disorder, unspecified: Secondary | ICD-10-CM | POA: Insufficient documentation

## 2024-03-22 DIAGNOSIS — F319 Bipolar disorder, unspecified: Secondary | ICD-10-CM | POA: Insufficient documentation

## 2024-03-22 DIAGNOSIS — F919 Conduct disorder, unspecified: Secondary | ICD-10-CM | POA: Insufficient documentation

## 2024-03-22 DIAGNOSIS — Z9151 Personal history of suicidal behavior: Secondary | ICD-10-CM | POA: Insufficient documentation

## 2024-03-22 DIAGNOSIS — E119 Type 2 diabetes mellitus without complications: Secondary | ICD-10-CM | POA: Insufficient documentation

## 2024-03-22 DIAGNOSIS — I509 Heart failure, unspecified: Secondary | ICD-10-CM | POA: Insufficient documentation

## 2024-03-22 DIAGNOSIS — R45851 Suicidal ideations: Secondary | ICD-10-CM | POA: Insufficient documentation

## 2024-03-22 DIAGNOSIS — Z79899 Other long term (current) drug therapy: Secondary | ICD-10-CM | POA: Insufficient documentation

## 2024-03-22 DIAGNOSIS — F69 Unspecified disorder of adult personality and behavior: Secondary | ICD-10-CM

## 2024-03-22 LAB — URINALYSIS, MICROSCOPIC (REFLEX)

## 2024-03-22 LAB — URINALYSIS, ROUTINE W REFLEX MICROSCOPIC
Bilirubin Urine: NEGATIVE
Glucose, UA: NEGATIVE mg/dL
Ketones, ur: NEGATIVE mg/dL
Leukocytes,Ua: NEGATIVE
Nitrite: NEGATIVE
Protein, ur: NEGATIVE mg/dL
Specific Gravity, Urine: <1.005 — ABNORMAL LOW (ref 1.005–1.030)
pH: 6 (ref 5.0–8.0)

## 2024-03-22 MED ORDER — LIDOCAINE 5 % EX PTCH
1.0000 | MEDICATED_PATCH | CUTANEOUS | Status: DC
  Administered 2024-03-22: 1 via TRANSDERMAL
  Filled 2024-03-22: qty 1

## 2024-03-22 MED ORDER — ACETAMINOPHEN 500 MG PO TABS
1000.0000 mg | ORAL_TABLET | Freq: Once | ORAL | Status: AC
  Administered 2024-03-22: 1000 mg via ORAL
  Filled 2024-03-22: qty 2

## 2024-03-22 NOTE — ED Notes (Signed)
 Call to guardian, left msg that patient is being d/c

## 2024-03-22 NOTE — ED Notes (Signed)
 Pt's caregiver called said she is out in the car waiting for the pt . The pt is getting escorted out by [employer] and security.

## 2024-03-22 NOTE — ED Provider Notes (Signed)
 " [place] [facility] Provider Note   CSN: [id number] Arrival date & time: 03/21/24  1735     Patient presents with: No chief complaint on file.   [name] is a 43 y.o. female with PMHx bipolar 1 disorder, DM, schizophrenia, IDA, GERD, CHF, headaches, HLD, asthma who presents to ED concerned for right knee pain x3 weeks.  Patient states that she tripped in her [employer] 3 weeks ago and has had right knee pain ever since.  Patient stating that the left knee pain is making it hard to walk on it is making her left leg feel weak and has caused her to have multiple falls since the initial incident.  Patient also endorsing chronic symptoms such as daily headaches which has been present since she was a child and not changed recently. Denies head trauma, LOC, seizures, blood thinners. Patient has not taken any OTC medications for her symptoms recently.  Denies fever, nausea, vomiting, diarrhea.  Patient's legal guardian able to provide more history. They are concerned that patient is acting out recently because her birthday is coming up. They are unsure if any of patient's symptoms or recent history is reliable.    HPI     Prior to Admission medications  Medication Sig Start Date End Date Taking? Authorizing Provider  albuterol  (VENTOLIN  HFA) 108 (90 Base) MCG/ACT inhaler Inhale 1-2 puffs into the lungs every 6 (six) hours as needed. 01/25/24   Tanda Bleacher, MD  clonazePAM (KLONOPIN) 0.5 MG tablet Take 0.5 mg by mouth 2 (two) times daily. 12/22/22   [provider]  ENTRESTO  24-26 MG Take 1 tablet by mouth 2 (two) times daily. 01/28/24   Patwardhan, Newman PARAS, MD  famotidine  (PEPCID ) 20 MG tablet Take 1 tablet (20 mg total) by mouth 2 (two) times daily. 01/25/24   Tanda Bleacher, MD  FEROSUL 325 (65 Fe) MG tablet Take 325 mg by mouth daily with breakfast. 01/09/23   [provider]  furosemide  (LASIX ) 40 MG tablet Take 1 tablet (40 mg  total) by mouth as needed for fluid. 01/28/24   Patwardhan, Newman PARAS, MD  hydrOXYzine  (ATARAX ) 50 MG tablet Take 1 tablet (50 mg total) by mouth daily. 02/15/24   Tanda Bleacher, MD  lamoTRIgine (LAMICTAL) 100 MG tablet Take 100 mg by mouth daily. 12/22/22   [provider]  Melatonin 10 MG TABS Take 1 tablet every day by oral route at bedtime for 30 days. 01/03/23   [provider]  metoprolol  succinate (TOPROL -XL) 100 MG 24 hr tablet Take 1 tablet (100 mg total) by mouth daily. 01/28/24   Patwardhan, Newman PARAS, MD  naproxen (NAPROSYN) 500 MG tablet Take 500 mg by mouth daily as needed for moderate pain (pain score 4-6).    [provider]  phentermine  (ADIPEX-P ) 37.5 MG tablet Take 1 tablet (37.5 mg total) by mouth daily before breakfast. 03/18/24   Tanda Bleacher, MD  spironolactone  (ALDACTONE ) 25 MG tablet Take 1 tablet (25 mg total) by mouth daily. 01/28/24   Patwardhan, Manish J, MD  TRINTELLIX 20 MG TABS tablet Take 20 mg by mouth daily. 12/22/22   [provider]    Allergies: Penicillins    Review of Systems  Musculoskeletal:        Knee pain    Updated Vital Signs BP (!) 138/106   Pulse 87   Temp 97.9 F (36.6 C) (Oral)   Resp 19   Ht 5' 3 (1.6 m)  Wt (!) 150.1 kg   SpO2 100%   BMI 58.62 kg/m   Physical Exam Vitals and nursing note reviewed.  Constitutional:      General: She is not in acute distress.    Appearance: She is not ill-appearing or toxic-appearing.  HENT:     Head: Normocephalic and atraumatic.     Mouth/Throat:     Mouth: Mucous membranes are moist.  Eyes:     General: No scleral icterus.       Right eye: No discharge.        Left eye: No discharge.     Conjunctiva/sclera: Conjunctivae normal.  Cardiovascular:     Rate and Rhythm: Normal rate and regular rhythm.     Pulses: Normal pulses.     Heart sounds: Normal heart sounds. No murmur heard. Pulmonary:     Effort: Pulmonary effort is normal. No respiratory  distress.     Breath sounds: Normal breath sounds. No wheezing, rhonchi or rales.  Abdominal:     General: Abdomen is flat. Bowel sounds are normal.     Palpations: Abdomen is soft.  Musculoskeletal:     Right lower leg: No edema.     Left lower leg: No edema.     Comments: Right knee: No swelling, erythema, or increased warmth appreciated.  +2 pedal pulse.  Sensation light touch intact.  Area nontense.  ROM intact.  Skin:    General: Skin is warm and dry.     Findings: No rash.  Neurological:     General: No focal deficit present.     Mental Status: She is alert and oriented to person, place, and time. Mental status is at baseline.     Comments: GCS 15. Speech is goal oriented. No deficits appreciated to CN III-XII; symmetric eyebrow raise, no facial drooping, tongue midline. Patient has equal grip strength bilaterally with 5/5 strength against resistance in all major muscle groups bilaterally. Sensation to light touch intact. Patient moves extremities without ataxia.   Psychiatric:        Mood and Affect: Mood normal.     (all labs ordered are listed, but only abnormal results are displayed) Labs Reviewed  CBC - Abnormal; Notable for the following components:      Result Value   MCV 75.1 (*)    MCH 25.2 (*)    All other components within normal limits  COMPREHENSIVE METABOLIC PANEL WITH GFR  HCG, SERUM, QUALITATIVE  MAGNESIUM  URINALYSIS, ROUTINE W REFLEX MICROSCOPIC  CBG MONITORING, ED    EKG: None  Radiology: DG Knee Complete 4 Views Right Result Date: 03/22/2024 EXAM: 4 OR MORE VIEW(S) XRAY OF THE KNEE 03/22/2024 08:04:00 AM COMPARISON: None available. CLINICAL HISTORY: fall FINDINGS: BONES AND JOINTS: No acute fracture. No malalignment. Small joint effusion. Sharpening of the tibial spines. Mild medial compartment joint space narrowing. SOFT TISSUES: The soft tissues are unremarkable. IMPRESSION: 1. Small joint effusion. 2. No acute osseous findings. Electronically  signed by: Waddell Calk MD 03/22/2024 08:11 AM EST RP Workstation: [id number]     Procedures   Medications Ordered in the ED  lidocaine  (LIDODERM ) 5 % 1 patch (1 patch Transdermal Patch Applied 03/22/24 0736)  acetaminophen  (TYLENOL ) tablet 1,000 mg (1,000 mg Oral Given 03/22/24 0736)                                    Medical Decision Making Amount and/or Complexity of  Data Reviewed Radiology: ordered.  Risk OTC drugs. Prescription drug management.   This patient presents to the ED for concern of knee pain, this involves an extensive number of treatment options, and is a complaint that carries with it a high risk of complications and morbidity.  The differential diagnosis includes hemarthrosis, gout, septic joint, fracture, tendonitis, muscle strain, bursitis, compartment syndrome   Co morbidities that complicate the patient evaluation  See HPI above   Additional history obtained:  Dr. Tanda PCP    Problem List / ED Course / Critical interventions / Medication management  Patient presents to ED concern for right knee pain which is leading to recurrent falls.  Patient injured her knee 3 weeks ago during a mechanical fall.  Patient stating that the pain is making her right leg feel weak.  Physical/neuroexam reassuring.  Patient afebrile with stable vitals. RN stating that patient was ambulatory all the way back to ED room from lobby. I Ordered, and personally interpreted labs.  The pertinent results include: CBC without leukocytosis or anemia.  hCG negative.  CMP reassuring.  CBG 77.  Magnesium 2.1.  EKG sinus rhythm. I ordered imaging studies including right knee x-ray. I independently visualized and interpreted imaging. I agree with the radiologist interpretation of small joint effusion.  Shared results with patient.  Answered all questions.  Recommended following up with PCP and orthopedics.  Patient agreeable with plan.  Educated patient alternate Advil  and Tylenol  for  pain management. I have reviewed the patients home medicines and have made adjustments as needed The patient has been appropriately medically screened and/or stabilized in the ED. I have low suspicion for any other emergent medical condition which would require further screening, evaluation or treatment in the ED or require inpatient management. At time of discharge the patient is hemodynamically stable and in no acute distress. I have discussed work-up results and diagnosis with patient and answered all questions. Patient is agreeable with discharge plan. We discussed strict return precautions for returning to the emergency department and they verbalized understanding.     Ddx these are considered less likely due to history of present illness and physical exam -hemarthrosis: joint without swelling; ROM intact -gout: no warmth or erythema; ROM intact  -septic joint: afebrile; no warmth or erythema; no skin changes; ROM intact  -fracture: xray without concern  -compartment syndrome: area not tense; neurovascularly intact   Social Determinants of Health:  Legal guardian      Final diagnoses:  Acute pain of right knee    ED Discharge Orders     None          Hoy Nidia FALCON, [place] 03/22/24 9090  "

## 2024-03-22 NOTE — ED Notes (Signed)
 Called the caregiver 3 times to notify her that the pt is up for discharge. The caregiver did not pick up.-voicemail left.

## 2024-03-22 NOTE — Discharge Instructions (Addendum)
 Please see the attached resource guide for further behavioral health management.

## 2024-03-22 NOTE — ED Notes (Signed)
 Pt escorted out with security stating she didn't want to leave. Guardian picked pt up. AVS provided by Rafe, RN. Pt dcd
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
## 2024-03-22 NOTE — ED Notes (Signed)
 Breaking glass on today's note to locate updated number for [facility].

## 2024-03-22 NOTE — ED Triage Notes (Signed)
 D/c from [facility], did not want to go home with her legal guardian Algie Hint)- so asked to be brought to the ED for anxiety
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
## 2024-03-22 NOTE — Discharge Instructions (Addendum)

## 2024-03-22 NOTE — ED Provider Notes (Signed)
 " [place] EMERGENCY DEPARTMENT AT [facility] Provider Note   CSN: [id number] Arrival date & time: 03/22/24  1358     Patient presents with: Anxiety   [name] is a 43 y.o. female with history of schizophrenia, bipolar, diabetes, CHF presents with anxiety.  She was just discharged from behavioral health this morning.  Did not meet admission criteria.  Patient was reportedly aggressive at that time and did not want to go home with her legal guardian.  She was asked to be brought to the emergency room for anxiety.  Upon my examination patient states that she is feeling fine is without any SI or HI.  She states that she is wants a bus pass.      Anxiety      Past Medical History:  Diagnosis Date   Bipolar 1 disorder (HCC)    Developmental delay    Diabetes mellitus without complication (HCC)    H/O tracheostomy    Hx of emotional problems    Obesity    Schizophrenia (HCC)    Past Surgical History:  Procedure Laterality Date   TONSILLECTOMY       Prior to Admission medications  Medication Sig Start Date End Date Taking? Authorizing Provider  albuterol  (VENTOLIN  HFA) 108 (90 Base) MCG/ACT inhaler Inhale 1-2 puffs into the lungs every 6 (six) hours as needed. 01/25/24   Tanda Bleacher, MD  clonazePAM (KLONOPIN) 0.5 MG tablet Take 0.5 mg by mouth 2 (two) times daily. 12/22/22   [provider]  ENTRESTO  24-26 MG Take 1 tablet by mouth 2 (two) times daily. 01/28/24   Patwardhan, Newman PARAS, MD  famotidine  (PEPCID ) 20 MG tablet Take 1 tablet (20 mg total) by mouth 2 (two) times daily. 01/25/24   Tanda Bleacher, MD  FEROSUL 325 (65 Fe) MG tablet Take 325 mg by mouth daily with breakfast. 01/09/23   [provider]  furosemide  (LASIX ) 40 MG tablet Take 1 tablet (40 mg total) by mouth as needed for fluid. 01/28/24   Patwardhan, Newman PARAS, MD  hydrOXYzine  (ATARAX ) 50 MG tablet Take 1 tablet (50 mg total) by mouth daily. 02/15/24   Tanda Bleacher, MD   lamoTRIgine (LAMICTAL) 100 MG tablet Take 100 mg by mouth daily. 12/22/22   [provider]  Melatonin 10 MG TABS Take 1 tablet every day by oral route at bedtime for 30 days. 01/03/23   [provider]  metoprolol  succinate (TOPROL -XL) 100 MG 24 hr tablet Take 1 tablet (100 mg total) by mouth daily. 01/28/24   Patwardhan, Newman PARAS, MD  naproxen (NAPROSYN) 500 MG tablet Take 500 mg by mouth daily as needed for moderate pain (pain score 4-6).    [provider]  phentermine  (ADIPEX-P ) 37.5 MG tablet Take 1 tablet (37.5 mg total) by mouth daily before breakfast. 03/18/24   Tanda Bleacher, MD  spironolactone  (ALDACTONE ) 25 MG tablet Take 1 tablet (25 mg total) by mouth daily. 01/28/24   Patwardhan, Manish J, MD  TRINTELLIX 20 MG TABS tablet Take 20 mg by mouth daily. 12/22/22   [provider]    Allergies: Penicillins    Review of Systems  Psychiatric/Behavioral:  The patient is nervous/anxious.     Updated Vital Signs BP (!) 175/101 (BP Location: Left Arm)   Pulse 79   Temp 98 F (36.7 C) (Oral)   Resp 19   SpO2 96%   Physical Exam Vitals and nursing note reviewed.  Constitutional:      General:  She is not in acute distress.    Appearance: She is well-developed.  HENT:     Head: Normocephalic and atraumatic.  Eyes:     Conjunctiva/sclera: Conjunctivae normal.  Cardiovascular:     Rate and Rhythm: Normal rate and regular rhythm.     Heart sounds: No murmur heard. Pulmonary:     Effort: Pulmonary effort is normal. No respiratory distress.     Breath sounds: Normal breath sounds.  Abdominal:     Palpations: Abdomen is soft.     Tenderness: There is no abdominal tenderness.  Musculoskeletal:        General: No swelling.     Cervical back: Neck supple.  Skin:    General: Skin is warm and dry.     Capillary Refill: Capillary refill takes less than 2 seconds.  Neurological:     General: No focal deficit present.     Mental Status: She is  alert.  Psychiatric:        Mood and Affect: Mood normal.     (all labs ordered are listed, but only abnormal results are displayed) Labs Reviewed - No data to display  EKG: None  Radiology: DG Knee Complete 4 Views Right Result Date: 03/22/2024 EXAM: 4 OR MORE VIEW(S) XRAY OF THE KNEE 03/22/2024 08:04:00 AM COMPARISON: None available. CLINICAL HISTORY: fall FINDINGS: BONES AND JOINTS: No acute fracture. No malalignment. Small joint effusion. Sharpening of the tibial spines. Mild medial compartment joint space narrowing. SOFT TISSUES: The soft tissues are unremarkable. IMPRESSION: 1. Small joint effusion. 2. No acute osseous findings. Electronically signed by: Waddell Calk MD 03/22/2024 08:11 AM EST RP Workstation: [id number]     Procedures   Medications Ordered in the ED - No data to display  Clinical Course as of 03/22/24 1723  Sat Mar 22, 2024  1656 Patient with history of schizophrenia and bipolar evaluated after being discharged from behavioral health this morning as she did not want to go home with her caretaker.  She is currently IVC by her caretaker.  Upon arrival she is hypertensive, otherwise hemodynamically stable and nontoxic-appearing.  She reports to me that she is feeling fine without any SI or HI.  States that she just needs a bus pass.  Will attempt to contact her caretaker as well as the behavioral health team for collateral. [JT]  1719 Discussed patient with Dr. Cole and Sherrell Culver, NP.  Do not feel the patient warrants any further inpatient care or reevaluation.  Although the caretaker filed the petition, they were notified from behavioral health that they would not sustain the IVC.  Patient will be discharged home for further outpatient management. [JT]    Clinical Course User Index [JT] Donnajean Lynwood DEL, PA-C                                 Medical Decision Making  This patient presents to the ED with chief complaint(s) of anxiety .  The complaint  involves an extensive differential diagnosis and also carries with it a high risk of complications and morbidity.   Pertinent past medical history as listed in HPI  The differential diagnosis includes  Do not suspect intoxication, withdrawal or acute psychosis Additional history obtained: Records reviewed Care Everywhere/External Records  Disposition:   Patient will be discharged home. The patient has been appropriately medically screened and/or stabilized in the ED. I have low suspicion for any other emergent medical condition  which would require further screening, evaluation or treatment in the ED or require inpatient management. At time of discharge the patient is hemodynamically stable and in no acute distress. I have discussed work-up results and diagnosis with patient and answered all questions. Patient is agreeable with discharge plan. We discussed strict return precautions for returning to the emergency department and they verbalized understanding.     Social Determinants of Health:   none  This note was dictated with voice recognition software.  Despite best efforts at proofreading, errors may have occurred which can change the documentation meaning.       Final diagnoses:  Anxiety    ED Discharge Orders     None          Donnajean Lynwood VEAR DEVONNA 03/22/24 1725    Patsey Lot, MD 03/22/24 2310  "

## 2024-03-22 NOTE — Discharge Instructions (Signed)
 Please follow-up with your primary care provider and orthopedics.  Seek emergency care if experiencing any new or worsening symptoms.  Alternating between 650 mg Tylenol  and 400 mg Advil : The best way to alternate taking Acetaminophen  (example Tylenol ) and Ibuprofen  (example Advil /Motrin ) is to take them 3 hours apart. For example, if you take ibuprofen  at 6 am you can then take Tylenol  at 9 am. You can continue this regimen throughout the day, making sure you do not exceed the recommended maximum dose for each drug.

## 2024-03-22 NOTE — ED Notes (Signed)
 Contacted after hours legal guardian, Franky Faster with the [facility] @ [phone]  Gave approval for pt to be discharged, provided pt with bus pass, sandwich and juice.

## 2024-03-24 ENCOUNTER — Telehealth: Payer: Self-pay

## 2024-03-24 ENCOUNTER — Other Ambulatory Visit: Payer: Self-pay | Admitting: Family Medicine

## 2024-03-24 NOTE — Telephone Encounter (Signed)
 Pt is needing sooner appointment to bring her in sooner to facilitate pre-op risk stratification. Left message for patient to return call. Can have appointment with APP or Dr. Elmira.

## 2024-03-24 NOTE — Telephone Encounter (Signed)
 Copied from CRM #[id number]. Topic: Clinical - Medication Refill >> Mar 24, 2024  1:48 PM Nessti S wrote: Medication: TRINTELLIX 20 MG TABS tablet clonazePAM (KLONOPIN) 0.5 MG tablet  Has the patient contacted their pharmacy? Yes (Agent: If no, request that the patient contact the pharmacy for the refill. If patient does not wish to contact the pharmacy document the reason why and proceed with request.) (Agent: If yes, when and what did the pharmacy advise?)  This is the patient's preferred pharmacy:  [facility] - [place], [place] - [address] [place] [address] Phone: [phone] Fax: [phone]  Is this the correct pharmacy for this prescription? Yes If no, delete pharmacy and type the correct one.   Has the prescription been filled recently? Yes  Is the patient out of the medication? Yes  Has the patient been seen for an appointment in the last year OR does the patient have an upcoming appointment? Yes  Can we respond through MyChart? Yes  Agent: Please be advised that Rx refills may take up to 3 business days. We ask that you follow-up with your pharmacy.

## 2024-03-25 NOTE — Telephone Encounter (Signed)
 Left message to call back.

## 2024-03-26 NOTE — Telephone Encounter (Signed)
 Requested medication (s) are due for refill today: Yes  Requested medication (s) are on the active medication list: Yes  Last refill:  12/22/22  Future visit scheduled: Yes  Notes to clinic:  Unable to refill per protocol, last refill by another provider, cannot delegate.     Requested Prescriptions  Pending Prescriptions Disp Refills   TRINTELLIX 20 MG TABS tablet 30 tablet     Sig: Take 1 tablet (20 mg total) by mouth daily.     Psychiatry: Antidepressants - Serotonin Modulator Passed - 03/26/2024 11:54 AM      Passed - Completed PHQ-2 or PHQ-9 in the last 360 days      Passed - Valid encounter within last 6 months    Recent Outpatient Visits           1 week ago Encounter for weight management   [place], MD   2 months ago Intertriginous candidiasis   [place] [facility], MD       Future Appointments             In 1 month Patwardhan, Newman PARAS, MD [facility], [facility]             clonazePAM (KLONOPIN) 0.5 MG tablet 30 tablet     Sig: Take 1 tablet (0.5 mg total) by mouth 2 (two) times daily.     Not Delegated - Psychiatry: Anxiolytics/Hypnotics 2 Failed - 03/26/2024 11:54 AM      Failed - This refill cannot be delegated      Passed - Urine Drug Screen completed in last 360 days      Passed - Patient is not pregnant      Passed - Valid encounter within last 6 months    Recent Outpatient Visits           1 week ago Encounter for weight management   [place] [facility], MD   2 months ago Intertriginous candidiasis    [facility] at [facility], MD       Future Appointments             In 1 month Patwardhan, Newman PARAS, MD [facility] at [facility] Dept of [facility], H&V

## 2024-03-31 NOTE — Telephone Encounter (Signed)
"  Left message to call back.   "

## 2024-04-02 ENCOUNTER — Other Ambulatory Visit: Payer: Self-pay | Admitting: Family Medicine

## 2024-04-04 NOTE — Telephone Encounter (Signed)
 Complete

## 2024-04-07 NOTE — Telephone Encounter (Signed)
"  Left message to call back.   "

## 2024-04-09 NOTE — Telephone Encounter (Signed)
 Left message to call back, per Dr. Elmira will keep current scheduled appointment due to not being able to reach the patient.

## 2024-04-10 ENCOUNTER — Other Ambulatory Visit: Payer: Self-pay | Admitting: Family Medicine

## 2024-04-11 ENCOUNTER — Other Ambulatory Visit: Payer: Self-pay | Admitting: Family

## 2024-04-14 NOTE — Telephone Encounter (Signed)
 Requested medication (s) are due for refill today: routing for review  Requested medication (s) are on the active medication list: no  Last refill:  01/30/23  Future visit scheduled: yes  Notes to clinic:  historical medication     Requested Prescriptions  Pending Prescriptions Disp Refills   clonazePAM (KLONOPIN) 0.5 MG tablet [Pharmacy Med Name: clonazepam 0.5 mg tablet] 62 tablet 1    Sig: TAKE ONE TABLET BY MOUTH TWICE DAILY. (ROUND ORANGE TABLET WITH 66)(GENERIC KLONOPIN)     Not Delegated - Psychiatry: Anxiolytics/Hypnotics 2 Failed - 04/14/2024  1:37 PM      Failed - This refill cannot be delegated      Passed - Urine Drug Screen completed in last 360 days      Passed - Patient is not pregnant      Passed - Valid encounter within last 6 months    Recent Outpatient Visits           3 weeks ago Encounter for weight management   [place] [facility], MD   2 months ago Intertriginous candidiasis   [place] [facility], MD       Future Appointments             In 1 week Patwardhan, Newman PARAS, MD [facility] at [facility] A Dept of The [place] [facility], H&[facility]             lamoTRIgine (LAMICTAL) 100 MG tablet [Pharmacy Med Name: lamotrigine 100 mg tablet] 31 tablet 2    Sig: TAKE ONE TABLET BY MOUTH IN THE MORNING. (WHITE TRIANGLE TABLET WITH UU 112)(GENERIC LAMICTAL)     Neurology:  Anticonvulsants - lamotrigine Passed - 04/14/2024  1:37 PM      Passed - Cr in normal range and within 360 days    Creatinine  Date Value Ref Range Status  07/15/2014 0.65 mg/dL Final    Comment:    9.55-8.99 NOTE: New Reference Range  06/09/14    Creatinine, Ser  Date Value Ref Range Status  03/21/2024 0.61 0.44 - 1.00 mg/dL Final         Passed - ALT in normal range and within 360 days    ALT  Date Value Ref Range Status  03/21/2024 14 0 - 44 U/L Final   SGPT (ALT)  Date Value Ref Range Status   07/15/2014 15 U/L Final    Comment:    14-54 NOTE: New Reference Range  06/09/14          Passed - AST in normal range and within 360 days    AST  Date Value Ref Range Status  03/21/2024 24 15 - 41 U/L Final    Comment:    HEMOLYSIS AT THIS LEVEL MAY AFFECT RESULT   SGOT(AST)  Date Value Ref Range Status  07/15/2014 22 U/L Final    Comment:    15-41 NOTE: New Reference Range  06/09/14          Passed - Completed PHQ-2 or PHQ-9 in the last 360 days      Passed - Valid encounter within last 12 months    Recent Outpatient Visits           3 weeks ago Encounter for weight management   [place] [facility], MD   2 months ago Intertriginous candidiasis   [place] [facility]  Tanda Bleacher, MD       Future Appointments             In 1 week Patwardhan, Newman PARAS, MD [facility] at [facility] A Dept of The [place], [facility]             TRINTELLIX 20 MG TABS tablet [Pharmacy Med Name: Trintellix 20 mg tablet] 31 tablet 2    Sig: TAKE ONE TABLET BY MOUTH IN THE MORNING. (BROWN TABLET WITH 20 TL)     Psychiatry: Antidepressants - Serotonin Modulator Passed - 04/14/2024  1:37 PM      Passed - Completed PHQ-2 or PHQ-9 in the last 360 days      Passed - Valid encounter within last 6 months    Recent Outpatient Visits           3 weeks ago Encounter for weight management   [place] [facility], MD   2 months ago Intertriginous candidiasis   [place] [facility], MD       Future Appointments             In 1 week Patwardhan, Newman PARAS, MD [facility] at [facility] A Dept of [facility] [place] [facility], [facility]             Melatonin 10 MG TABS [Pharmacy Med Name: Melatonin 10mg ] 30 tablet 2    Sig: TAKE ONE TABLET BY MOUTH AT BEDTIME. (WHITE ROUND TAB WITH NO MARKINGS)     Over the Counter:  OTC Passed - 04/14/2024  1:37 PM       Passed - Valid encounter within last 12 months    Recent Outpatient Visits           3 weeks ago Encounter for weight management   [place] [facility], MD   2 months ago Intertriginous candidiasis   [place] [facility], MD       Future Appointments             In 1 week Patwardhan, Newman PARAS, MD [facility] Heart[facility] at [facility] A Dept of [facility] [place] [facility], H&V

## 2024-04-15 ENCOUNTER — Ambulatory Visit: Payer: Self-pay | Admitting: Family Medicine

## 2024-04-16 ENCOUNTER — Emergency Department (HOSPITAL_COMMUNITY)
Admission: EM | Admit: 2024-04-16 | Discharge: 2024-04-17 | Disposition: A | Discharge status: Home / Self Care | Payer: MEDICAID | Attending: Emergency Medicine | Admitting: Emergency Medicine

## 2024-04-16 DIAGNOSIS — K5900 Constipation, unspecified: Secondary | ICD-10-CM | POA: Insufficient documentation

## 2024-04-16 DIAGNOSIS — D72829 Elevated white blood cell count, unspecified: Secondary | ICD-10-CM | POA: Insufficient documentation

## 2024-04-16 DIAGNOSIS — F419 Anxiety disorder, unspecified: Secondary | ICD-10-CM | POA: Insufficient documentation

## 2024-04-16 DIAGNOSIS — N649 Disorder of breast, unspecified: Secondary | ICD-10-CM | POA: Insufficient documentation

## 2024-04-16 NOTE — ED Triage Notes (Signed)
 BIB [facility] from [facility]. Dizziness and abd pain for 2 weeks. Hx CHF.  C/o wound to left breast.

## 2024-04-17 ENCOUNTER — Telehealth: Payer: Self-pay

## 2024-04-17 ENCOUNTER — Other Ambulatory Visit: Payer: Self-pay

## 2024-04-17 DIAGNOSIS — N649 Disorder of breast, unspecified: Secondary | ICD-10-CM

## 2024-04-17 LAB — CBC WITH DIFFERENTIAL/PLATELET
Abs Immature Granulocytes: 0.03 K/uL (ref 0.00–0.07)
Basophils Absolute: 0 K/uL (ref 0.0–0.1)
Basophils Relative: 0 %
Eosinophils Absolute: 0.1 K/uL (ref 0.0–0.5)
Eosinophils Relative: 1 %
HCT: 40.9 % (ref 36.0–46.0)
Hemoglobin: 13.8 g/dL (ref 12.0–15.0)
Immature Granulocytes: 0 %
Lymphocytes Relative: 28 %
Lymphs Abs: 3.5 K/uL (ref 0.7–4.0)
MCH: 24.6 pg — ABNORMAL LOW (ref 26.0–34.0)
MCHC: 33.7 g/dL (ref 30.0–36.0)
MCV: 73 fL — ABNORMAL LOW (ref 80.0–100.0)
Monocytes Absolute: 0.9 K/uL (ref 0.1–1.0)
Monocytes Relative: 7 %
Neutro Abs: 8 K/uL — ABNORMAL HIGH (ref 1.7–7.7)
Neutrophils Relative %: 64 %
Platelets: 438 K/uL — ABNORMAL HIGH (ref 150–400)
RBC: 5.6 MIL/uL — ABNORMAL HIGH (ref 3.87–5.11)
RDW: 14.6 % (ref 11.5–15.5)
WBC: 12.6 K/uL — ABNORMAL HIGH (ref 4.0–10.5)
nRBC: 0 % (ref 0.0–0.2)

## 2024-04-17 LAB — COMPREHENSIVE METABOLIC PANEL WITH GFR
ALT: 27 U/L (ref 0–44)
AST: 25 U/L (ref 15–41)
Albumin: 4.2 g/dL (ref 3.5–5.0)
Alkaline Phosphatase: 97 U/L (ref 38–126)
Anion gap: 12 (ref 5–15)
BUN: 9 mg/dL (ref 6–20)
CO2: 24 mmol/L (ref 22–32)
Calcium: 9.7 mg/dL (ref 8.9–10.3)
Chloride: 103 mmol/L (ref 98–111)
Creatinine, Ser: 0.53 mg/dL (ref 0.44–1.00)
GFR, Estimated: >60 mL/min
Glucose, Bld: 88 mg/dL (ref 70–99)
Potassium: 4 mmol/L (ref 3.5–5.1)
Sodium: 140 mmol/L (ref 135–145)
Total Bilirubin: 1.3 mg/dL — ABNORMAL HIGH (ref 0.0–1.2)
Total Protein: 9.1 g/dL — ABNORMAL HIGH (ref 6.5–8.1)

## 2024-04-17 LAB — URINALYSIS, ROUTINE W REFLEX MICROSCOPIC
Bilirubin Urine: NEGATIVE
Glucose, UA: NEGATIVE mg/dL
Hgb urine dipstick: NEGATIVE
Ketones, ur: 5 mg/dL — AB
Leukocytes,Ua: NEGATIVE
Nitrite: NEGATIVE
Protein, ur: 100 mg/dL — AB
Specific Gravity, Urine: 1.031 — ABNORMAL HIGH (ref 1.005–1.030)
pH: 5 (ref 5.0–8.0)

## 2024-04-17 LAB — MAGNESIUM: Magnesium: 2 mg/dL (ref 1.7–2.4)

## 2024-04-17 LAB — HCG, SERUM, QUALITATIVE: Preg, Serum: NEGATIVE

## 2024-04-17 MED ORDER — ACETAMINOPHEN 325 MG PO TABS: 650.0000 mg | ORAL_TABLET | Freq: Four times a day (QID) | ORAL | 0 refills | Status: AC | PRN

## 2024-04-17 MED ORDER — DOXYCYCLINE HYCLATE 100 MG PO CAPS: 100.0000 mg | ORAL_CAPSULE | Freq: Two times a day (BID) | ORAL | 0 refills | Status: AC

## 2024-04-17 MED ORDER — ACETAMINOPHEN 325 MG PO TABS
650.0000 mg | ORAL_TABLET | Freq: Once | ORAL | Status: AC
  Administered 2024-04-17: 650 mg via ORAL
  Filled 2024-04-17: qty 2

## 2024-04-17 MED ORDER — DOXYCYCLINE HYCLATE 100 MG PO TABS
100.0000 mg | ORAL_TABLET | Freq: Once | ORAL | Status: AC
  Administered 2024-04-17: 100 mg via ORAL
  Filled 2024-04-17: qty 1

## 2024-04-17 MED ORDER — DOCUSATE SODIUM 100 MG PO CAPS: 100.0000 mg | ORAL_CAPSULE | Freq: Every day | ORAL | 0 refills | Status: AC | PRN

## 2024-04-17 NOTE — Telephone Encounter (Signed)
 Copied from CRM #[id number]. Topic: Referral - Question >> Apr 17, 2024  8:50 AM Revonda D wrote: Reason for CRM: Pt stated that she is experiencing leakage from her breast and went to [facility]. Pt stated that she was informed that she may have breast cancer and to urgently to reach out to Dr.Wilson to get an order submitted to the Breast Center. Pt would like to receive a callback as soon as possible with an update on this concern.

## 2024-04-17 NOTE — Telephone Encounter (Signed)
 Attempted to call pt to advise we have sent the order to the breast center. No answer, unable to LVM

## 2024-04-17 NOTE — ED Provider Notes (Signed)
 " [place] EMERGENCY DEPARTMENT AT [facility] Provider Note   CSN: [id number] Arrival date & time: 04/16/24  1759     Patient presents with: No chief complaint on file.   [name] is a 44 y.o. female.   The history is provided by the patient and medical records.   [name] is a 44 y.o. female who presents to the Emergency Department complaining of anxiety.  She presents to the emergency department for anxiety.  She states that she does not like her roommate.  She states that she has heart throughout her body and has not had a bowel movement or urinated since November.  No fevers, SI, HI.    Prior to Admission medications  Medication Sig Start Date End Date Taking? Authorizing Provider  acetaminophen  (TYLENOL ) 325 MG tablet Take 2 tablets (650 mg total) by mouth every 6 (six) hours as needed for moderate pain (pain score 4-6). 04/17/24  Yes Griselda Norris, MD  docusate sodium  (COLACE) 100 MG capsule Take 1 capsule (100 mg total) by mouth daily as needed for mild constipation. 04/17/24  Yes Griselda Norris, MD  doxycycline  (VIBRAMYCIN ) 100 MG capsule Take 1 capsule (100 mg total) by mouth 2 (two) times daily. 04/17/24  Yes Griselda Norris, MD  albuterol  (VENTOLIN  HFA) 108 [phone] Base) MCG/ACT inhaler Inhale 1-2 puffs into the lungs every 6 (six) hours as needed. 01/25/24   Tanda Bleacher, MD  clonazePAM (KLONOPIN) 0.5 MG tablet Take 0.5 mg by mouth 2 (two) times daily. 12/22/22   [provider]  ENTRESTO  24-26 MG Take 1 tablet by mouth 2 (two) times daily. 01/28/24   Patwardhan, Newman PARAS, MD  famotidine  (PEPCID ) 20 MG tablet Take 1 tablet (20 mg total) by mouth 2 (two) times daily. 01/25/24   Tanda Bleacher, MD  FEROSUL 325 (65 Fe) MG tablet Take 325 mg by mouth daily with breakfast. 01/09/23   [provider]  furosemide  (LASIX ) 40 MG tablet Take 1 tablet (40 mg total) by mouth as needed for fluid. 01/28/24   Patwardhan, Newman PARAS, MD  hydrOXYzine   (VISTARIL ) 50 MG capsule TAKE ONE CAPSULE BY MOUTH EVERY DAY. [facility] AND WHITE CAPSULE WITH EP112,or [phone] **GENERIC VISTARIL **) 04/04/24   Jaycee Greig PARAS, NP  lamoTRIgine (LAMICTAL) 100 MG tablet Take 100 mg by mouth daily. 12/22/22   [provider]  Melatonin 10 MG TABS Take 1 tablet every day by oral route at bedtime for 30 days. 01/03/23   [provider]  metoprolol  succinate (TOPROL -XL) 100 MG 24 hr tablet Take 1 tablet (100 mg total) by mouth daily. 01/28/24   Patwardhan, Newman PARAS, MD  naproxen (NAPROSYN) 500 MG tablet Take 500 mg by mouth daily as needed for moderate pain (pain score 4-6).    [provider]  phentermine  (ADIPEX-P ) 37.5 MG tablet Take 1 tablet (37.5 mg total) by mouth daily before breakfast. 03/18/24   Tanda Bleacher, MD  spironolactone  (ALDACTONE ) 25 MG tablet Take 1 tablet (25 mg total) by mouth daily. 01/28/24   Patwardhan, Manish J, MD  TRINTELLIX 20 MG TABS tablet Take 20 mg by mouth daily. 12/22/22   [provider]    Allergies: Penicillins    Review of Systems  All other systems reviewed and are negative.   Updated Vital Signs BP (!) 142/88 (BP Location: Right Arm)   Pulse 88   Temp 97.9 F (36.6 C)   Resp 17   SpO2 99%   Physical Exam Vitals and  nursing note reviewed.  Constitutional:      Appearance: She is well-developed.  HENT:     Head: Normocephalic and atraumatic.  Cardiovascular:     Rate and Rhythm: Normal rate and regular rhythm.     Heart sounds: No murmur heard. Pulmonary:     Effort: Pulmonary effort is normal. No respiratory distress.     Breath sounds: Normal breath sounds.  Abdominal:     Palpations: Abdomen is soft.     Tenderness: There is no abdominal tenderness. There is no guarding or rebound.  Musculoskeletal:        General: No tenderness.  Skin:    General: Skin is warm and dry.  Neurological:     Mental Status: She is alert and oriented to person, place, and time.  Psychiatric:         Behavior: Behavior normal.     (all labs ordered are listed, but only abnormal results are displayed) Labs Reviewed  COMPREHENSIVE METABOLIC PANEL WITH GFR - Abnormal; Notable for the following components:      Result Value   Total Protein 9.1 (*)    Total Bilirubin 1.3 (*)    All other components within normal limits  CBC WITH DIFFERENTIAL/PLATELET - Abnormal; Notable for the following components:   WBC 12.6 (*)    RBC 5.60 (*)    MCV 73.0 (*)    MCH 24.6 (*)    Platelets 438 (*)    Neutro Abs 8.0 (*)    All other components within normal limits  URINALYSIS, ROUTINE W REFLEX MICROSCOPIC - Abnormal; Notable for the following components:   APPearance HAZY (*)    Specific Gravity, Urine 1.031 (*)    Ketones, ur 5 (*)    Protein, ur 100 (*)    Bacteria, UA RARE (*)    All other components within normal limits  HCG, SERUM, QUALITATIVE  MAGNESIUM    EKG: EKG Interpretation Date/Time:  Thursday April 17 2024 02:06:21 EST Ventricular Rate:  87 PR Interval:  150 QRS Duration:  76 QT Interval:  428 QTC Calculation: 515 R Axis:   89  Text Interpretation: Normal sinus rhythm Prolonged QT Abnormal ECG Confirmed by Griselda Norris [phone]) on 04/17/2024 2:21:43 AM  Radiology: No results found.   Procedures   Medications Ordered in the ED  doxycycline  (VIBRA -TABS) tablet 100 mg (has no administration in time range)  acetaminophen  (TYLENOL ) tablet 650 mg (650 mg Oral Given 04/17/24 0330)                                    Medical Decision Making Amount and/or Complexity of Data Reviewed Labs: ordered.  Risk OTC drugs. Prescription drug management.   Patient with history of schizophrenia here for evaluation of anxiety.  On review of systems also complains of constipation and not urinating since November.  She is urinating without difficulty in the emergency department, abdomen is soft and nontender.  At time of reevaluation for discharge patient does comment that she  has had a lesion on her left breast since December.  She does state that it drains sometimes and is tender at times.  On examination she has 2 ulcers to the left breast, 1 is approximately 2 cm in diameter, does not have active drainage, has minimal surrounding erythema without edema.  There is a second half centimeter ulceration without significant erythema or edema.  Discussed with patient concern that  this needs to be further evaluated with mammography.  There is no clinical evidence of abscess at this time.  Given mild erythema as well as mild leukocytosis will start on doxycycline  for possible soft tissue infection.  Discussed how important follow-up is to rule out breast cancer that she needs to have additional evaluation for these lesions.  Discussed return precautions.     Final diagnoses:  Anxiety  Constipation, unspecified constipation type  Breast lesion    ED Discharge Orders          Ordered    docusate sodium  (COLACE) 100 MG capsule  Daily PRN        04/17/24 0435    acetaminophen  (TYLENOL ) 325 MG tablet  Every 6 hours PRN        04/17/24 0435    doxycycline  (VIBRAMYCIN ) 100 MG capsule  2 times daily        04/17/24 0443    AMBULATORY REFERRAL TO BREAST SPECIALIST        04/17/24 0445               Griselda Norris, MD 04/17/24 0448  "

## 2024-04-17 NOTE — ED Notes (Signed)
 Pt ambulated in hallway with slight limp and steady gait.

## 2024-04-24 ENCOUNTER — Other Ambulatory Visit: Payer: Self-pay | Admitting: Family

## 2024-04-24 NOTE — Telephone Encounter (Signed)
 Requested by interface surescripts. Future visit 05/20/24. Requested Prescriptions  Pending Prescriptions Disp Refills   hydrOXYzine  (VISTARIL ) 50 MG capsule [Pharmacy Med Name: hydroxyzine  pamoate 50 mg capsule] 31 capsule 0    Sig: TAKE ONE CAPSULE BY MOUTH EVERY DAY. (GREEN AND WHITE CAPSULE WITH EP112,or [phone] **GENERIC VISTARIL **)     Ear, Nose, and Throat:  Antihistamines 2 Passed - 04/24/2024  1:05 PM      Passed - Cr in normal range and within 360 days    Creatinine  Date Value Ref Range Status  07/15/2014 0.65 mg/dL Final    Comment:    9.55-8.99 NOTE: New Reference Range  06/09/14    Creatinine, Ser  Date Value Ref Range Status  04/17/2024 0.53 0.44 - 1.00 mg/dL Final         Passed - Valid encounter within last 12 months    Recent Outpatient Visits           1 month ago Encounter for weight management   [place] [facility], MD   3 months ago Intertriginous candidiasis   [place] [facility], MD       Future Appointments             Tomorrow Patwardhan, Newman PARAS, MD [facility] HeartCare at [facility] A Dept of The [place]. [facility], H&V

## 2024-04-25 ENCOUNTER — Other Ambulatory Visit (HOSPITAL_COMMUNITY): Payer: Self-pay

## 2024-04-25 ENCOUNTER — Ambulatory Visit: Payer: MEDICAID | Attending: Cardiology | Admitting: Cardiology

## 2024-04-25 ENCOUNTER — Telehealth: Payer: Self-pay | Admitting: Cardiology

## 2024-04-25 VITALS — BP 145/91 | HR 107 | Ht 63.0 in | Wt 318.4 lb

## 2024-04-25 DIAGNOSIS — Z5900 Homelessness unspecified: Secondary | ICD-10-CM | POA: Diagnosis present

## 2024-04-25 DIAGNOSIS — Z789 Other specified health status: Secondary | ICD-10-CM | POA: Diagnosis present

## 2024-04-25 DIAGNOSIS — I10 Essential (primary) hypertension: Secondary | ICD-10-CM

## 2024-04-25 DIAGNOSIS — I5020 Unspecified systolic (congestive) heart failure: Secondary | ICD-10-CM | POA: Diagnosis present

## 2024-04-25 MED ORDER — LOSARTAN POTASSIUM 50 MG PO TABS
50.0000 mg | ORAL_TABLET | Freq: Every day | ORAL | 5 refills | Status: AC
  Filled 2024-04-25: qty 30, 30d supply, fill #0

## 2024-04-25 MED ORDER — METOPROLOL SUCCINATE ER 50 MG PO TB24
50.0000 mg | ORAL_TABLET | Freq: Every day | ORAL | 5 refills | Status: AC
  Filled 2024-04-25: qty 30, 30d supply, fill #0

## 2024-04-25 NOTE — Telephone Encounter (Signed)
 Hi. Pt needs a 4wk fu with Patwardhan. No preferences, just first available. Thank you.
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
## 2024-04-25 NOTE — Patient Instructions (Signed)
 Medication Instructions:  STOP Entresto    START Losartan 50 mg daily  Resume Metoprolol  at 50 mg daily  *If you need a refill on your cardiac medications before your next appointment, please call your pharmacy*   Follow-Up: At [facility], you and your health needs are our priority.  As part of our continuing mission to provide you with exceptional heart care, our providers are all part of one team.  This team includes your primary Cardiologist (physician) and Advanced Practice Providers or APPs (Physician Assistants and Nurse Practitioners) who all work together to provide you with the care you need, when you need it.  Your next appointment:   4 week(s)  Provider:   Newman JINNY Lawrence, MD
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
## 2024-04-26 ENCOUNTER — Encounter: Payer: Self-pay | Admitting: Cardiology

## 2024-04-26 DIAGNOSIS — I5020 Unspecified systolic (congestive) heart failure: Secondary | ICD-10-CM | POA: Insufficient documentation

## 2024-04-26 DIAGNOSIS — Z789 Other specified health status: Secondary | ICD-10-CM | POA: Insufficient documentation

## 2024-04-26 DIAGNOSIS — Z5900 Homelessness unspecified: Secondary | ICD-10-CM | POA: Insufficient documentation

## 2024-04-29 ENCOUNTER — Emergency Department (HOSPITAL_COMMUNITY)
Admission: EM | Admit: 2024-04-29 | Discharge: 2024-04-29 | Disposition: A | Discharge status: Home / Self Care | Payer: MEDICAID | Attending: Emergency Medicine | Admitting: Emergency Medicine

## 2024-04-29 ENCOUNTER — Other Ambulatory Visit: Payer: Self-pay

## 2024-04-29 ENCOUNTER — Encounter (HOSPITAL_COMMUNITY): Payer: Self-pay

## 2024-04-29 DIAGNOSIS — R059 Cough, unspecified: Secondary | ICD-10-CM | POA: Diagnosis not present

## 2024-04-29 DIAGNOSIS — Y9222 Religious institution as the place of occurrence of the external cause: Secondary | ICD-10-CM | POA: Insufficient documentation

## 2024-04-29 DIAGNOSIS — M25511 Pain in right shoulder: Secondary | ICD-10-CM | POA: Diagnosis present

## 2024-04-29 DIAGNOSIS — W000XXA Fall on same level due to ice and snow, initial encounter: Secondary | ICD-10-CM | POA: Insufficient documentation

## 2024-04-29 DIAGNOSIS — M546 Pain in thoracic spine: Secondary | ICD-10-CM | POA: Diagnosis not present

## 2024-04-29 DIAGNOSIS — W19XXXA Unspecified fall, initial encounter: Secondary | ICD-10-CM

## 2024-04-29 MED ORDER — ACETAMINOPHEN 500 MG PO TABS
1000.0000 mg | ORAL_TABLET | Freq: Once | ORAL | Status: AC
  Administered 2024-04-29: 1000 mg via ORAL
  Filled 2024-04-29: qty 2
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
## 2024-04-29 NOTE — Discharge Instructions (Signed)
 1.  Your physical exam does not suggest there are any broken bones at this time.  You may take over-the-counter Tylenol  per package instructions for pain.  You may apply cold packs to any areas that are swollen or bruised. 2.  Follow-up with your family doctor for recheck in 5 to 7 days.
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
## 2024-05-02 ENCOUNTER — Other Ambulatory Visit (HOSPITAL_COMMUNITY): Payer: Self-pay

## 2024-05-03 ENCOUNTER — Other Ambulatory Visit: Payer: Self-pay

## 2024-05-03 ENCOUNTER — Emergency Department (HOSPITAL_COMMUNITY)
Admission: EM | Admit: 2024-05-03 | Discharge: 2024-05-03 | Disposition: A | Discharge status: Home / Self Care | Payer: MEDICAID | Attending: Emergency Medicine | Admitting: Emergency Medicine

## 2024-05-03 ENCOUNTER — Emergency Department (HOSPITAL_COMMUNITY): Payer: MEDICAID

## 2024-05-03 DIAGNOSIS — I509 Heart failure, unspecified: Secondary | ICD-10-CM | POA: Diagnosis not present

## 2024-05-03 DIAGNOSIS — R059 Cough, unspecified: Secondary | ICD-10-CM | POA: Diagnosis present

## 2024-05-03 DIAGNOSIS — R051 Acute cough: Secondary | ICD-10-CM | POA: Diagnosis not present

## 2024-05-03 MED ORDER — ALBUTEROL SULFATE HFA 108 (90 BASE) MCG/ACT IN AERS
2.0000 | INHALATION_SPRAY | RESPIRATORY_TRACT | Status: DC
  Administered 2024-05-03: 2 via RESPIRATORY_TRACT
  Filled 2024-05-03: qty 6.7

## 2024-05-03 MED ORDER — ONDANSETRON 4 MG PO TBDP
4.0000 mg | ORAL_TABLET | Freq: Once | ORAL | Status: AC
  Administered 2024-05-03: 4 mg via ORAL
  Filled 2024-05-03: qty 1

## 2024-05-03 MED ORDER — IPRATROPIUM-ALBUTEROL 0.5-2.5 (3) MG/3ML IN SOLN
3.0000 mL | Freq: Once | RESPIRATORY_TRACT | Status: AC
  Administered 2024-05-03: 3 mL via RESPIRATORY_TRACT
  Filled 2024-05-03: qty 3

## 2024-05-03 NOTE — ED Triage Notes (Signed)
 BIBA from [facility]- pt has had a cough for 2 weeks, having pain with inspiration, nausea vomiting since yesterday. Pt has a hx of CHF, hasn't taken Lasix  since 11/25. 96% r/a 162/92 bp 108 hr

## 2024-05-03 NOTE — ED Provider Notes (Signed)
 " [place] EMERGENCY DEPARTMENT AT [facility] Provider Note   CSN: [id number] Arrival date & time: 05/03/24  1528     Patient presents with: Cough and Emesis   [name] is a 44 y.o. female.   Patient complains of coughing.  Patient reports that she has coughed so hard that she vomits.  Patient reports that she has a history of congestive heart failure.  Patient denies any fever she denies any chills she has not had any abdominal pain.   Cough Emesis Associated symptoms: cough        Prior to Admission medications  Medication Sig Start Date End Date Taking? Authorizing Provider  acetaminophen  (TYLENOL ) 325 MG tablet Take 2 tablets (650 mg total) by mouth every 6 (six) hours as needed for moderate pain (pain score 4-6). Patient not taking: Reported on 04/25/2024 04/17/24   Griselda Norris, MD  albuterol  (VENTOLIN  HFA) 108 [phone] Base) MCG/ACT inhaler Inhale 1-2 puffs into the lungs every 6 (six) hours as needed. Patient not taking: Reported on 04/25/2024 01/25/24   Tanda Bleacher, MD  clonazePAM  (KLONOPIN ) 0.5 MG tablet Take 0.5 mg by mouth 2 (two) times daily. Patient not taking: Reported on 04/25/2024 12/22/22   [provider]  docusate sodium  (COLACE) 100 MG capsule Take 1 capsule (100 mg total) by mouth daily as needed for mild constipation. Patient not taking: Reported on 04/25/2024 04/17/24   Griselda Norris, MD  doxycycline  (VIBRAMYCIN ) 100 MG capsule Take 1 capsule (100 mg total) by mouth 2 (two) times daily. Patient not taking: Reported on 04/25/2024 04/17/24   Griselda Norris, MD  famotidine  (PEPCID ) 20 MG tablet Take 1 tablet (20 mg total) by mouth 2 (two) times daily. Patient not taking: Reported on 04/25/2024 01/25/24   Tanda Bleacher, MD  FEROSUL 325 (65 Fe) MG tablet Take 325 mg by mouth daily with breakfast. Patient not taking: Reported on 04/25/2024 01/09/23   [provider]  furosemide  (LASIX ) 40 MG tablet Take 1 tablet (40 mg total) by  mouth as needed for fluid. Patient not taking: Reported on 04/25/2024 01/28/24   Elmira Newman PARAS, MD  hydrOXYzine  (VISTARIL ) 50 MG capsule TAKE ONE CAPSULE BY MOUTH EVERY DAY. (GREEN AND WHITE CAPSULE WITH EP112,or [phone] **GENERIC VISTARIL **) Patient not taking: Reported on 04/25/2024 04/24/24   Jaycee Greig PARAS, NP  lamoTRIgine  (LAMICTAL ) 100 MG tablet Take 100 mg by mouth daily. Patient not taking: Reported on 04/25/2024 12/22/22   [provider]  losartan  (COZAAR ) 50 MG tablet Take 1 tablet (50 mg total) by mouth daily. 04/25/24   Patwardhan, Newman PARAS, MD  Melatonin 10 MG TABS Take 1 tablet every day by oral route at bedtime for 30 days. Patient not taking: Reported on 04/25/2024 01/03/23   [provider]  metoprolol  succinate (TOPROL -XL) 50 MG 24 hr tablet Take 1 tablet (50 mg total) by mouth daily. 04/25/24   Patwardhan, Newman PARAS, MD  naproxen (NAPROSYN) 500 MG tablet Take 500 mg by mouth daily as needed for moderate pain (pain score 4-6). Patient not taking: Reported on 04/25/2024    [provider]  phentermine  (ADIPEX-P ) 37.5 MG tablet Take 1 tablet (37.5 mg total) by mouth daily before breakfast. Patient not taking: Reported on 04/25/2024 03/18/24   Tanda Bleacher, MD  spironolactone  (ALDACTONE ) 25 MG tablet Take 1 tablet (25 mg total) by mouth daily. Patient not taking: Reported on 04/25/2024 01/28/24   Elmira Newman PARAS, MD  TRINTELLIX  20 MG TABS tablet Take 20 mg  by mouth daily. Patient not taking: Reported on 04/25/2024 12/22/22   [provider]    Allergies: Penicillins    Review of Systems  Respiratory:  Positive for cough.   Gastrointestinal:  Positive for vomiting.  All other systems reviewed and are negative.   Updated Vital Signs BP (!) 151/104   Pulse (!) 103   Temp 97.8 F (36.6 C) (Oral)   Resp 18   SpO2 95%   Physical Exam Vitals and nursing note reviewed.  Constitutional:      Appearance: She is well-developed.  HENT:      Head: Normocephalic.  Cardiovascular:     Rate and Rhythm: Normal rate.  Pulmonary:     Effort: Pulmonary effort is normal.  Abdominal:     General: There is no distension.  Musculoskeletal:        General: Normal range of motion.     Cervical back: Normal range of motion.  Skin:    General: Skin is warm.  Neurological:     General: No focal deficit present.     Mental Status: She is alert and oriented to person, place, and time.     (all labs ordered are listed, but only abnormal results are displayed) Labs Reviewed - No data to display  EKG: None  Radiology: DG Chest 2 View Result Date: 05/03/2024 CLINICAL DATA:  Cough for 2 weeks.  Nausea and vomiting. EXAM: CHEST - 2 VIEW COMPARISON:  01/24/2024. FINDINGS: Heart is enlarged and the mediastinal contour stable. Lung volumes are low. No consolidation, effusion, or pneumothorax is seen. No acute osseous abnormality. IMPRESSION: Stable chest with no active cardiopulmonary disease. Electronically Signed   By: Leita Birmingham M.D.   On: 05/03/2024 16:50     Procedures   Medications Ordered in the ED  albuterol  (VENTOLIN  HFA) 108 (90 Base) MCG/ACT inhaler 2 puff (2 puffs Inhalation Provided for home use 05/03/24 1844)  ipratropium-albuterol  (DUONEB) 0.5-2.5 (3) MG/3ML nebulizer solution 3 mL (3 mLs Nebulization Given 05/03/24 1843)  ondansetron  (ZOFRAN -ODT) disintegrating tablet 4 mg (4 mg Oral Given 05/03/24 1851)                                    Medical Decision Making Patient complains of a cough.  Patient reports she is coughing so much she has vomited.  Patient states she is homeless she is staying at a shelter  Amount and/or Complexity of Data Reviewed External Data Reviewed: notes.    Details: Cardiology notes reviewed,  Pt has a low normal EF.  Radiology: ordered.    Details: Chest xray  no acute findings   Risk Prescription drug management. Risk Details: Pt given an albuterol  inhaler here.  Patient is given ODT  Zofran .  Patient states she has used an inhaler in the past.        Final diagnoses:  Acute cough    ED Discharge Orders     None       An After Visit Summary was printed and given to the patient.    [name] 05/03/24 ALVIE    Garrick Charleston, MD 05/03/24 2148  "

## 2024-05-03 NOTE — Discharge Instructions (Addendum)
 Return if any problems. Use the inhaler 2 puffs every 4 hours as needed

## 2024-05-05 ENCOUNTER — Other Ambulatory Visit: Payer: Self-pay

## 2024-05-05 ENCOUNTER — Other Ambulatory Visit (HOSPITAL_COMMUNITY): Payer: Self-pay

## 2024-05-05 NOTE — Congregational Nurse Program (Signed)
" °  Dept: [phone]   Congregational Nurse Program Note  Date of Encounter: 05/05/2024  Past Medical History: Past Medical History:  Diagnosis Date   Bipolar 1 disorder (HCC)    Developmental delay    Diabetes mellitus without complication (HCC)    H/O tracheostomy    Hx of emotional problems    Obesity    Schizophrenia [facility])     Encounter Details:  Community Questionnaire - 05/05/24 1310       Questionnaire   Ask client: Do you give verbal consent for me to treat you today? Yes    Student Assistance N/A    Location Patient Served  [facility]    Encounter Setting CN site    Population Status Unhoused    Insurance Medicaid    Insurance/Financial Assistance Referral N/A    Medication Have Medication Insecurities    Medical Provider Yes    Medical Referrals Made NA    Medical Appointment Completed N/A    Screenings CN Performed (remember to also record results) NA    CN Interventions Other Education;Navigate Healthcare System;Health Counseling    ED Visit Averted N/A         RN saw client today at [facility]. Client reported needing assistance with her medications. Client stated she currently only has two blood pressure medications available. Client reported that her medications are typically filled in [place]. RN contacted [facility] [facility] [employer] on [address] and requested that the clients medications be transferred. Pharmacy technician informed RN that only two active medications are currently on file for the client. RN relayed this information to the client. RN recommended that the client meet with RN at [facility] tomorrow so they can contact the clients primary care provider together to complete medication reconciliation. Client verbalized understanding. No other acute needs at this time. RN able to provide spiritual and emotional support.  "

## 2024-05-06 ENCOUNTER — Telehealth: Payer: Self-pay

## 2024-05-06 ENCOUNTER — Other Ambulatory Visit (HOSPITAL_COMMUNITY): Payer: Self-pay

## 2024-05-06 ENCOUNTER — Other Ambulatory Visit: Payer: Self-pay

## 2024-05-06 MED ORDER — DOXYCYCLINE HYCLATE 100 MG PO CAPS: 100.0000 mg | ORAL_CAPSULE | Freq: Two times a day (BID) | ORAL | 0 refills | Status: AC

## 2024-05-06 MED ORDER — FUROSEMIDE 40 MG PO TABS: 40.0000 mg | ORAL_TABLET | Freq: Every day | ORAL | 1 refills | Status: AC | PRN

## 2024-05-06 MED ORDER — FAMOTIDINE 20 MG PO TABS: 20.0000 mg | ORAL_TABLET | Freq: Two times a day (BID) | ORAL | 3 refills | Status: AC

## 2024-05-06 MED ORDER — SACUBITRIL-VALSARTAN 24-26 MG PO TABS: 1.0000 | ORAL_TABLET | Freq: Two times a day (BID) | ORAL | 1 refills | Status: AC

## 2024-05-06 MED ORDER — FERROUS SULFATE 325 (65 FE) MG PO TABS: 325.0000 mg | ORAL_TABLET | Freq: Every day | ORAL | 3 refills | Status: AC

## 2024-05-06 MED ORDER — LAMOTRIGINE 100 MG PO TABS: 100.0000 mg | ORAL_TABLET | Freq: Every morning | ORAL | 2 refills | Status: AC

## 2024-05-06 MED ORDER — SPIRONOLACTONE 25 MG PO TABS: 25.0000 mg | ORAL_TABLET | Freq: Every day | ORAL | 3 refills | Status: AC

## 2024-05-06 MED ORDER — TRINTELLIX 20 MG PO TABS: 20.0000 mg | ORAL_TABLET | Freq: Every morning | ORAL | 3 refills | Status: AC

## 2024-05-06 MED ORDER — PHENTERMINE HCL 37.5 MG PO TABS: 37.5000 mg | ORAL_TABLET | Freq: Every day | ORAL | 0 refills | Status: AC

## 2024-05-06 MED ORDER — CLOTRIMAZOLE 1 % EX CREA: 1.0000 | TOPICAL_CREAM | Freq: Two times a day (BID) | CUTANEOUS | 1 refills | Status: AC | PRN

## 2024-05-06 MED ORDER — ALBUTEROL SULFATE HFA 108 (90 BASE) MCG/ACT IN AERS: 1.0000 | INHALATION_SPRAY | Freq: Four times a day (QID) | RESPIRATORY_TRACT | 0 refills | Status: AC | PRN

## 2024-05-06 MED ORDER — CLONAZEPAM 0.5 MG PO TABS: 0.5000 mg | ORAL_TABLET | Freq: Two times a day (BID) | ORAL | 1 refills | Status: AC

## 2024-05-06 MED ORDER — HYDROXYZINE PAMOATE 50 MG PO CAPS: 50.0000 mg | ORAL_CAPSULE | Freq: Every day | ORAL | 1 refills | Status: AC

## 2024-05-06 MED ORDER — METOPROLOL SUCCINATE ER 100 MG PO TB24: 100.0000 mg | ORAL_TABLET | Freq: Every day | ORAL | 1 refills | Status: AC

## 2024-05-06 MED ORDER — MELATONIN 10 MG PO TABS: 10.0000 mg | ORAL_TABLET | Freq: Every day | ORAL | 5 refills | Status: AC

## 2024-05-06 MED ORDER — OMEPRAZOLE 20 MG PO CPDR: 20.0000 mg | DELAYED_RELEASE_CAPSULE | Freq: Every day | ORAL | 3 refills | Status: AC

## 2024-05-06 MED ORDER — SPIRONOLACTONE 25 MG PO TABS: 25.0000 mg | ORAL_TABLET | Freq: Every day | ORAL | 1 refills | Status: AC

## 2024-05-06 NOTE — Telephone Encounter (Signed)
 I called patient to see if she would like to schedule surgery with Dr. Cleotilde on 06/02/24 at [facility]. The voicemail led to a female's voice stating thanks for calling the [employer]. I called back a second time and the owner verified the cell number belong to his job and we have the wrong number on file.[phone]). Please have patient to update her contact number.

## 2024-05-07 ENCOUNTER — Other Ambulatory Visit (HOSPITAL_COMMUNITY): Payer: Self-pay

## 2024-05-08 ENCOUNTER — Other Ambulatory Visit (HOSPITAL_COMMUNITY): Payer: Self-pay

## 2024-05-20 ENCOUNTER — Ambulatory Visit: Payer: MEDICAID | Admitting: Family Medicine

## 2024-06-18 ENCOUNTER — Ambulatory Visit: Payer: Self-pay | Admitting: Family Medicine
